# Patient Record
Sex: Female | Born: 1975 | Hispanic: No | Marital: Married | Smoking: Never smoker
Health system: Southern US, Community
[De-identification: ages and names within clinical notes are randomized; demographics above are authoritative.]

## PROBLEM LIST (undated history)

## (undated) DIAGNOSIS — J302 Other seasonal allergic rhinitis: Secondary | ICD-10-CM

## (undated) DIAGNOSIS — L309 Dermatitis, unspecified: Secondary | ICD-10-CM

## (undated) DIAGNOSIS — M797 Fibromyalgia: Secondary | ICD-10-CM

## (undated) HISTORY — PX: SEPTOPLASTY: SUR1290

## (undated) HISTORY — PX: TUBAL LIGATION: SHX77

## (undated) HISTORY — PX: WISDOM TOOTH EXTRACTION: SHX21

## (undated) HISTORY — PX: NASAL SINUS SURGERY: SHX719

## (undated) HISTORY — PX: LIPOMA EXCISION: SHX5283

---

## 2009-07-23 ENCOUNTER — Ambulatory Visit (HOSPITAL_COMMUNITY): Admission: RE | Admit: 2009-07-23 | Discharge: 2009-07-23 | Payer: Self-pay | Admitting: Obstetrics and Gynecology

## 2010-10-23 HISTORY — PX: DILATION AND CURETTAGE OF UTERUS: SHX78

## 2010-12-30 ENCOUNTER — Other Ambulatory Visit: Payer: Self-pay | Admitting: Obstetrics and Gynecology

## 2010-12-30 ENCOUNTER — Ambulatory Visit (HOSPITAL_COMMUNITY)
Admission: AD | Admit: 2010-12-30 | Discharge: 2010-12-30 | Disposition: A | Discharge status: Home / Self Care | Payer: BC Managed Care – PPO | Source: Ambulatory Visit | Attending: Obstetrics and Gynecology | Admitting: Obstetrics and Gynecology

## 2010-12-30 DIAGNOSIS — N949 Unspecified condition associated with female genital organs and menstrual cycle: Secondary | ICD-10-CM | POA: Insufficient documentation

## 2010-12-30 DIAGNOSIS — N938 Other specified abnormal uterine and vaginal bleeding: Secondary | ICD-10-CM | POA: Insufficient documentation

## 2010-12-30 DIAGNOSIS — D500 Iron deficiency anemia secondary to blood loss (chronic): Secondary | ICD-10-CM | POA: Insufficient documentation

## 2010-12-30 LAB — CBC
HCT: 37.9 % (ref 36.0–46.0)
MCHC: 31.1 g/dL (ref 30.0–36.0)
Platelets: 365 10*3/uL (ref 150–400)
RDW: 14.1 % (ref 11.5–15.5)
WBC: 10.7 10*3/uL — ABNORMAL HIGH (ref 4.0–10.5)

## 2011-01-14 NOTE — Op Note (Signed)
  Lisa [name], Lisa [name]               ACCOUNT NO.:  [id number]  MEDICAL RECORD NO.:  [id number]           PATIENT TYPE:  O  LOCATION:  [facility]                          [facility]AC[facility]:  [facility]  PHYSICIAN:  Juluis Mire, M.D.   DATE OF BIRTH:  23-Jul-1976  DATE OF PROCEDURE:  12/30/2010 DATE OF DISCHARGE:                              OPERATIVE REPORT   PREOPERATIVE DIAGNOSIS:  Dysfunction uterine bleeding with associated anemia.  POSTOPERATIVE DIAGNOSIS:  Dysfunction uterine bleeding with associated anemia.  OPERATIVE PROCEDURE:  Paracervical block with dilatation and curettage.  SURGEON:  Juluis Mire, MD.  ANESTHESIA:  General along with paracervical block.  ESTIMATED BLOOD LOSS:  Probably 200 mL.  PACKS AND DRAINS:  None.  INJECTABLES PLACED:  None.  COMPLICATIONS:  None.  INDICATIONS:  Dictated in the history and physical.  PROCEDURE:  As follows, the patient was taken to the OR and placed in supine position.  After satisfactory level of general anesthesia was obtained, the patient was placed in the dorsal lithotomy position using the Allen stirrups.  The perineum and vagina are prepped out with Betadine and draped into sterile field.  Speculum was placed in the vaginal vault.  Moderate clots and bleeding were noted.  Cervix was grasped with a single-tooth tenaculum.  We were able to insert a paracervical block using 1% Xylocaine with epinephrine.  Cervix was actually already fully dilated and we were able to insert a curette.  We sharply curetted the uterine cavity retrieving moderate amounts of tissue and clotted material.  This was sent for pathological review.  We continue curetting until no additional tissue was obtained and we felt a gritty feeling in all quadrants.  We then explored the uterine cavity with ring forceps.  There was no signs of additional tissue.  Repeat curetting also retrieved no additional tissue.  At this point in time, the patient was contracting  down well.  Bleeding was minimal.  The single-tooth speculum was then removed.  The patient was taken out of dorsal lithotomy position.  Once alert and extubated, transferred to recovery room in good condition.  Sponge, instrument, and needle count was correct by circulating nurse.     Juluis Mire, M.D.     JSM/MEDQ  D:  12/30/2010  T:  12/31/2010  Job:  [id number]  Electronically Signed by Richardean Chimera M.D. on 01/14/2011 06:05:20 AM

## 2011-01-14 NOTE — H&P (Signed)
  NAME:  Lisa [name], Lisa [name]               ACCOUNT NO.:  [id number]  MEDICAL RECORD NO.:  [id number]           PATIENT TYPE:  O  LOCATION:  [facility]                          [facility]  PHYSICIAN:  Juluis Mire, M.D.   DATE OF BIRTH:  1976-06-09  DATE OF ADMISSION:  12/30/2010 DATE OF DISCHARGE:                             HISTORY & PHYSICAL   The patient is a 35 year old gravida 2, para 2 female who has been seen in our practice since early February with dysfunctional uterine bleeding.  She had been tried on various forms of birth control pills without response, came in today for an ultrasound, was having extremely excessive bleeding.  Her hemoglobin was down to 10.4.  On ultrasound evaluation, she has markedly thickened endometrium.  There is a lot of fluid in the cul-de-sac probably from the ongoing bleeding.  Both ovaries are normal.  On exam, she was having a fairly heavy significant bleeding with clots.  ALLERGIES:  In terms of allergies, she is allergic to VICODIN, PREDNISONE, PERCOCET and EFFEXOR.  PAST MEDICAL HISTORY:  She has history of arthritis as well as migraine headaches.  PAST SURGICAL HISTORY:  Removal of fatty tumor, septoplasty, sinus surgery and 2 vaginal deliveries and I believe she has had an Essure tubal.  FAMILY HISTORY:  History of hypertension, diabetes and heart disease.  SOCIAL HISTORY:  No tobacco or alcohol use.  REVIEW OF SYSTEMS:  Noncontributory.  PHYSICAL EXAMINATION:  VITAL SIGNS:  The patient is afebrile with stable vital signs. LUNGS:  Clear. CARDIOVASCULAR:  Regular rhythm rate.  There are no murmurs or gallops. ABDOMEN:  Benign.  No mass, organomegaly or tenderness. PELVIS:  She is having heavy bleeding with clots.  Cervix is partially dilated.  Uterus upper limits of normal size.  Adnexa unremarkable. EXTREMITIES:  Trace edema. NEUROLOGIC:  Grossly within normal limits.  IMPRESSION:  Dysfunctional bleeding with associated menorrhagia  and anemia.  PLAN:  The patient will be brought in and undergo a D and C to stop the bleeding.  We may observe her overnight pending the results of the D and C.  The nature of a D and C, risks and benefits have been discussed including the risk of infection.  Risk of hemorrhage that could require continued need for transfusion, excessive bleeding could require hysterectomy.  There is a risk of perforation leading to injury to adjacent organs requiring further exploratory surgery.  Risk of deep venous thrombosis and pulmonary embolus.  The patient expressed understanding to potential risks and complications.     Juluis Mire, M.D.     JSM/MEDQ  D:  12/30/2010  T:  12/31/2010  Job:  [id number]  Electronically Signed by Richardean Chimera M.D. on 01/14/2011 06:05:18 AM

## 2011-01-26 LAB — CBC
MCHC: 33.7 g/dL (ref 30.0–36.0)
MCV: 84 fL (ref 78.0–100.0)
Platelets: 348 10*3/uL (ref 150–400)
WBC: 9.4 10*3/uL (ref 4.0–10.5)

## 2011-01-26 LAB — PREGNANCY, URINE: Preg Test, Ur: NEGATIVE

## 2011-10-24 HISTORY — PX: MOUTH SURGERY: SHX715

## 2012-08-19 ENCOUNTER — Encounter (HOSPITAL_COMMUNITY): Payer: Self-pay | Admitting: Pharmacist

## 2012-08-27 ENCOUNTER — Inpatient Hospital Stay (HOSPITAL_COMMUNITY): Admission: RE | Admit: 2012-08-27 | Payer: BC Managed Care – PPO | Source: Ambulatory Visit

## 2012-08-27 NOTE — H&P (Signed)
NAMEREAGHAN, KAWA               ACCOUNT NO.:  [id number]  MEDICAL RECORD NO.:  [id number]  LOCATION:  [facility]                         [facility]  PHYSICIAN:  Juluis Mire, M.D.   DATE OF BIRTH:  August 24, 1976  DATE OF ADMISSION:  07/25/2012 DATE OF DISCHARGE:                             HISTORY & PHYSICAL   Surgery was scheduled for September 02, 2012, at [facility] here in [place].  HISTORY OF PRESENT ILLNESS:  The patient is a 36 year old gravida 2, para 2 married female, presents for laparoscopic-assisted vaginal hysterectomy.  In relation to the present admission, the patient has been trouble with persistent abnormal uterine bleeding.  It did require a D and C for management due to excessive bleeding and a lower in hemoglobin.  This D and C was performed on March 9 of this year. Pathology from the endometrium revealed fragments of the endometrial polyps with no evidence of hyperplasia or malignancy.  She has continued to have irregular abnormal bleeding.  We tried to control it with hormonal means including the Mirena IUD without success.  After discussion of options, she now presents for laparoscopic-assisted vaginal hysterectomy.  ALLERGIES:  In terms of allergy, she is allergic to PERCOCET, EFFEXOR, and VICODIN.  Also allergic to PREDNISONE.  PAST MEDICAL HISTORY:  She does have a history of arthritis as well as migraine headaches.  PREVIOUS SURGICAL HISTORY:  She has had a fatty tumor removed.  She has had a septoplasty, sinus surgery, bilateral tubal ligation, two vaginal deliveries.  SOCIAL HISTORY:  No tobacco and occasional alcohol use.  FAMILY HISTORY:  Family history of hypertension, colon cancer, diabetes, phlebitis.  REVIEW OF SYSTEMS:  Noncontributory.  PHYSICAL EXAMINATION:  VITAL SIGNS:  The patient is afebrile with stable vital signs. HEENT:  The patient is normocephalic.  Pupils are equal, round, and reactive to light and accommodation.   Extraocular movements are intact. Sclerae and conjunctivae are clear.  Oropharynx clear. NECK:  Without thyromegaly. BREASTS:  Not examined. LUNGS:  Clear. CARDIOVASCULAR:  Regular rhythm and rate without murmurs or gallops. ABDOMEN:  Benign.  No mass, organomegaly, or tenderness. PELVIC:  Normal external genitalia.  Vaginal mucosa clear.  Cervix unremarkable.  Uterus, upper limits of normal size.  Adnexa unremarkable. EXTREMITIES:  Trace edema. NEUROLOGIC:  Grossly within normal limits.  IMPRESSION:  Abnormal uterine bleeding with associated anemia, unable to control with conservative management.  PLAN:  The patient to undergo laparoscopic-assisted vaginal hysterectomy.  The nature of the procedure have been discussed.  Risks include the risk of infection.  The risk of hemorrhage that could require transfusion with the risk of AIDS or hepatitis.  There is a risk of injury to adjacent organs including bladder, bowel, ureters that could require further exploratory surgery, risk of deep venous thrombosis and pulmonary embolus.  The patient expressed understanding of indications, risks, and alternatives.     Juluis Mire, M.D.     JSM/MEDQ  D:  08/27/2012  T:  08/27/2012  Job:  [id number]

## 2012-08-27 NOTE — H&P (Signed)
  Patient name  Lisa [name], Lisa [name] DICTATION#  [id number] CSN# [id number]  [facility], MD 08/27/2012 9:51 AM

## 2012-08-30 ENCOUNTER — Encounter (HOSPITAL_COMMUNITY)
Admission: RE | Admit: 2012-08-30 | Discharge: 2012-08-30 | Disposition: A | Discharge status: Home / Self Care | Payer: BC Managed Care – PPO | Source: Ambulatory Visit | Attending: Obstetrics and Gynecology | Admitting: Obstetrics and Gynecology

## 2012-08-30 ENCOUNTER — Encounter (HOSPITAL_COMMUNITY): Payer: Self-pay

## 2012-08-30 HISTORY — DX: Other seasonal allergic rhinitis: J30.2

## 2012-08-30 HISTORY — DX: Fibromyalgia: M79.7

## 2012-08-30 HISTORY — DX: Dermatitis, unspecified: L30.9

## 2012-08-30 LAB — CBC
MCH: 27 pg (ref 26.0–34.0)
MCV: 83.7 fL (ref 78.0–100.0)
Platelets: 308 10*3/uL (ref 150–400)
RDW: 14.2 % (ref 11.5–15.5)
WBC: 9.3 10*3/uL (ref 4.0–10.5)

## 2012-08-30 LAB — SURGICAL PCR SCREEN: MRSA, PCR: NEGATIVE

## 2012-08-30 NOTE — Patient Instructions (Addendum)
   Your procedure is scheduled WR:UEAVWU November 11th  Enter through the Main Entrance of [facility] at:6am Pick up the phone at the desk and dial [phone] and inform us of your arrival.  Please call this number if you have any problems the morning of surgery: [phone]  Remember: Do not eat or drink anything after midnight on Sunday night   Do not wear jewelry, make-up, or FINGER nail polish No metal in your hair or on your body. Do not wear lotions, powders, perfumes. You may wear deodorant.  Please use your CHG wash as directed prior to surgery.  Do not shave anywhere for at least 12 hours prior to first CHG shower.  Do not bring valuables to the hospital. Please bring a case to protect your eyeglasses  Leave suitcase in the car. After Surgery it may be brought to your room. For patients being admitted to the hospital, checkout time is 11:00am the day of discharge.

## 2012-09-01 ENCOUNTER — Encounter (HOSPITAL_COMMUNITY): Payer: Self-pay | Admitting: Anesthesiology

## 2012-09-01 NOTE — Anesthesia Preprocedure Evaluation (Addendum)
Anesthesia Evaluation  Patient identified by MRN, date of birth, ID band Patient awake    Reviewed: Allergy & Precautions, H&P , NPO status , Patient's Chart, lab work & pertinent test results  Airway Mallampati: III TM Distance: >3 FB Neck ROM: Full    Dental No notable dental hx. (+) Teeth Intact   Pulmonary neg pulmonary ROS,  breath sounds clear to auscultation  Pulmonary exam normal       Cardiovascular negative cardio ROS  Rhythm:Regular Rate:Normal     Neuro/Psych  Neuromuscular disease negative psych ROS   GI/Hepatic negative GI ROS, Neg liver ROS,   Endo/Other  negative endocrine ROS  Renal/GU negative Renal ROS  negative genitourinary   Musculoskeletal  (+) Fibromyalgia -  Abdominal Normal abdominal exam  (+) + obese,   Peds  Hematology negative hematology ROS (+)   Anesthesia Other Findings   Reproductive/Obstetrics Menorrhagia                          Anesthesia Physical Anesthesia Plan  ASA: II  Anesthesia Plan: General   Post-op Pain Management:    Induction: Intravenous  Airway Management Planned: Oral ETT  Additional Equipment:   Intra-op Plan:   Post-operative Plan: Extubation in OR  Informed Consent: I have reviewed the patients History and Physical, chart, labs and discussed the procedure including the risks, benefits and alternatives for the proposed anesthesia with the patient or authorized representative who has indicated his/her understanding and acceptance.   Dental advisory given  Plan Discussed with: Anesthesiologist, CRNA and Surgeon  Anesthesia Plan Comments:         Anesthesia Quick Evaluation

## 2012-09-02 ENCOUNTER — Encounter (HOSPITAL_COMMUNITY): Payer: Self-pay | Admitting: *Deleted

## 2012-09-02 ENCOUNTER — Encounter (HOSPITAL_COMMUNITY)
Admission: RE | Disposition: A | Discharge status: Home / Self Care | Payer: Self-pay | Source: Ambulatory Visit | Attending: Obstetrics and Gynecology

## 2012-09-02 ENCOUNTER — Observation Stay (HOSPITAL_COMMUNITY)
Admission: RE | Admit: 2012-09-02 | Discharge: 2012-09-03 | Disposition: A | Discharge status: Home / Self Care | Payer: BC Managed Care – PPO | Source: Ambulatory Visit | Attending: Obstetrics and Gynecology | Admitting: Obstetrics and Gynecology

## 2012-09-02 ENCOUNTER — Encounter (HOSPITAL_COMMUNITY): Payer: Self-pay | Admitting: Anesthesiology

## 2012-09-02 ENCOUNTER — Ambulatory Visit (HOSPITAL_COMMUNITY): Payer: BC Managed Care – PPO | Admitting: Anesthesiology

## 2012-09-02 DIAGNOSIS — N8000 Endometriosis of the uterus, unspecified: Secondary | ICD-10-CM | POA: Insufficient documentation

## 2012-09-02 DIAGNOSIS — Z01818 Encounter for other preprocedural examination: Secondary | ICD-10-CM | POA: Insufficient documentation

## 2012-09-02 DIAGNOSIS — D252 Subserosal leiomyoma of uterus: Secondary | ICD-10-CM | POA: Insufficient documentation

## 2012-09-02 DIAGNOSIS — N920 Excessive and frequent menstruation with regular cycle: Principal | ICD-10-CM | POA: Insufficient documentation

## 2012-09-02 DIAGNOSIS — Z01812 Encounter for preprocedural laboratory examination: Secondary | ICD-10-CM | POA: Insufficient documentation

## 2012-09-02 DIAGNOSIS — N946 Dysmenorrhea, unspecified: Secondary | ICD-10-CM

## 2012-09-02 HISTORY — PX: LAPAROSCOPIC ASSISTED VAGINAL HYSTERECTOMY: SHX5398

## 2012-09-02 SURGERY — HYSTERECTOMY, VAGINAL, LAPAROSCOPY-ASSISTED
Anesthesia: General | Site: Abdomen | Wound class: Clean Contaminated

## 2012-09-02 MED ORDER — NEOSTIGMINE METHYLSULFATE 1 MG/ML IJ SOLN
INTRAMUSCULAR | Status: DC | PRN
  Administered 2012-09-02: 2 mg via INTRAVENOUS

## 2012-09-02 MED ORDER — LIDOCAINE HCL (CARDIAC) 20 MG/ML IV SOLN
INTRAVENOUS | Status: AC
  Filled 2012-09-02: qty 5

## 2012-09-02 MED ORDER — METOCLOPRAMIDE HCL 5 MG/ML IJ SOLN: 10.0000 mg | Freq: Once | INTRAMUSCULAR | Status: DC | PRN

## 2012-09-02 MED ORDER — LACTATED RINGERS IV SOLN
INTRAVENOUS | Status: DC
  Administered 2012-09-02 (×3): via INTRAVENOUS

## 2012-09-02 MED ORDER — NEOSTIGMINE METHYLSULFATE 1 MG/ML IJ SOLN
INTRAMUSCULAR | Status: AC
  Filled 2012-09-02: qty 10

## 2012-09-02 MED ORDER — GLYCOPYRROLATE 0.2 MG/ML IJ SOLN
INTRAMUSCULAR | Status: DC | PRN
  Administered 2012-09-02: 0.4 mg via INTRAVENOUS

## 2012-09-02 MED ORDER — FENTANYL CITRATE 0.05 MG/ML IJ SOLN
INTRAMUSCULAR | Status: AC
  Filled 2012-09-02: qty 5

## 2012-09-02 MED ORDER — DEXAMETHASONE SODIUM PHOSPHATE 10 MG/ML IJ SOLN
INTRAMUSCULAR | Status: AC
  Filled 2012-09-02: qty 1

## 2012-09-02 MED ORDER — MEPERIDINE HCL 25 MG/ML IJ SOLN: 6.2500 mg | INTRAMUSCULAR | Status: DC | PRN

## 2012-09-02 MED ORDER — ONDANSETRON HCL 4 MG/2ML IJ SOLN
INTRAMUSCULAR | Status: AC
  Filled 2012-09-02: qty 2

## 2012-09-02 MED ORDER — DIPHENHYDRAMINE HCL 12.5 MG/5ML PO ELIX
12.5000 mg | ORAL_SOLUTION | Freq: Four times a day (QID) | ORAL | Status: DC | PRN
  Filled 2012-09-02: qty 5

## 2012-09-02 MED ORDER — MIDAZOLAM HCL 2 MG/2ML IJ SOLN
INTRAMUSCULAR | Status: AC
  Filled 2012-09-02: qty 2

## 2012-09-02 MED ORDER — FENTANYL CITRATE 0.05 MG/ML IJ SOLN
INTRAMUSCULAR | Status: AC
  Administered 2012-09-02: 50 ug via INTRAVENOUS
  Filled 2012-09-02: qty 2

## 2012-09-02 MED ORDER — DIPHENHYDRAMINE HCL 50 MG/ML IJ SOLN
INTRAMUSCULAR | Status: AC
  Administered 2012-09-02: 12.5 mg via INTRAVENOUS
  Filled 2012-09-02: qty 1

## 2012-09-02 MED ORDER — ZOLPIDEM TARTRATE 5 MG PO TABS: 5.0000 mg | ORAL_TABLET | Freq: Every evening | ORAL | Status: DC | PRN

## 2012-09-02 MED ORDER — FENTANYL CITRATE 0.05 MG/ML IJ SOLN
25.0000 ug | INTRAMUSCULAR | Status: DC | PRN
  Administered 2012-09-02 (×2): 50 ug via INTRAVENOUS

## 2012-09-02 MED ORDER — ONDANSETRON HCL 4 MG/2ML IJ SOLN: 4.0000 mg | Freq: Four times a day (QID) | INTRAMUSCULAR | Status: DC | PRN

## 2012-09-02 MED ORDER — LACTATED RINGERS IR SOLN
Status: DC | PRN
  Administered 2012-09-02: 3000 mL

## 2012-09-02 MED ORDER — ROCURONIUM BROMIDE 50 MG/5ML IV SOLN
INTRAVENOUS | Status: AC
  Filled 2012-09-02: qty 1

## 2012-09-02 MED ORDER — DEXAMETHASONE SODIUM PHOSPHATE 10 MG/ML IJ SOLN
INTRAMUSCULAR | Status: DC | PRN
  Administered 2012-09-02: 10 mg via INTRAVENOUS

## 2012-09-02 MED ORDER — FENTANYL CITRATE 0.05 MG/ML IJ SOLN
INTRAMUSCULAR | Status: DC | PRN
  Administered 2012-09-02: 50 ug via INTRAVENOUS
  Administered 2012-09-02 (×2): 100 ug via INTRAVENOUS
  Administered 2012-09-02 (×3): 50 ug via INTRAVENOUS
  Administered 2012-09-02: 100 ug via INTRAVENOUS

## 2012-09-02 MED ORDER — MENTHOL 3 MG MT LOZG: 1.0000 | LOZENGE | OROMUCOSAL | Status: DC | PRN

## 2012-09-02 MED ORDER — MIDAZOLAM HCL 5 MG/5ML IJ SOLN
INTRAMUSCULAR | Status: DC | PRN
  Administered 2012-09-02: 2 mg via INTRAVENOUS

## 2012-09-02 MED ORDER — PROPOFOL 10 MG/ML IV EMUL
INTRAVENOUS | Status: AC
  Filled 2012-09-02: qty 20

## 2012-09-02 MED ORDER — CEFAZOLIN SODIUM-DEXTROSE 2-3 GM-% IV SOLR
2.0000 g | INTRAVENOUS | Status: AC
  Administered 2012-09-02: 2 g via INTRAVENOUS

## 2012-09-02 MED ORDER — BUPIVACAINE HCL (PF) 0.25 % IJ SOLN
INTRAMUSCULAR | Status: DC | PRN
  Administered 2012-09-02: 30 mL

## 2012-09-02 MED ORDER — ROCURONIUM BROMIDE 100 MG/10ML IV SOLN
INTRAVENOUS | Status: DC | PRN
  Administered 2012-09-02: 40 mg via INTRAVENOUS

## 2012-09-02 MED ORDER — LIDOCAINE HCL (CARDIAC) 20 MG/ML IV SOLN
INTRAVENOUS | Status: DC | PRN
  Administered 2012-09-02: 50 mg via INTRAVENOUS

## 2012-09-02 MED ORDER — BUPIVACAINE-EPINEPHRINE (PF) 0.5% -1:200000 IJ SOLN
INTRAMUSCULAR | Status: AC
  Filled 2012-09-02: qty 10

## 2012-09-02 MED ORDER — ACETAMINOPHEN 325 MG PO TABS
650.0000 mg | ORAL_TABLET | ORAL | Status: DC | PRN
  Administered 2012-09-03: 650 mg via ORAL
  Filled 2012-09-02: qty 2

## 2012-09-02 MED ORDER — PROPOFOL 10 MG/ML IV BOLUS
INTRAVENOUS | Status: DC | PRN
  Administered 2012-09-02: 150 mg via INTRAVENOUS

## 2012-09-02 MED ORDER — HYDROMORPHONE 0.3 MG/ML IV SOLN
INTRAVENOUS | Status: DC
  Administered 2012-09-02: 11:00:00 via INTRAVENOUS
  Administered 2012-09-02: 0.2 mg via INTRAVENOUS
  Administered 2012-09-02 (×2): 0.799 mg via INTRAVENOUS
  Filled 2012-09-02: qty 25

## 2012-09-02 MED ORDER — 0.9 % SODIUM CHLORIDE (POUR BTL) OPTIME
TOPICAL | Status: DC | PRN
  Administered 2012-09-02: 1000 mL

## 2012-09-02 MED ORDER — CEFAZOLIN SODIUM-DEXTROSE 2-3 GM-% IV SOLR
INTRAVENOUS | Status: AC
  Filled 2012-09-02: qty 50

## 2012-09-02 MED ORDER — NALOXONE HCL 0.4 MG/ML IJ SOLN: 0.4000 mg | INTRAMUSCULAR | Status: DC | PRN

## 2012-09-02 MED ORDER — ONDANSETRON HCL 4 MG PO TABS: 4.0000 mg | ORAL_TABLET | Freq: Four times a day (QID) | ORAL | Status: DC | PRN

## 2012-09-02 MED ORDER — INDIGOTINDISULFONATE SODIUM 8 MG/ML IJ SOLN
INTRAMUSCULAR | Status: DC | PRN
  Administered 2012-09-02: 5 mL via INTRAVENOUS

## 2012-09-02 MED ORDER — BUPIVACAINE HCL (PF) 0.25 % IJ SOLN
INTRAMUSCULAR | Status: AC
  Filled 2012-09-02: qty 30

## 2012-09-02 MED ORDER — ONDANSETRON HCL 4 MG/2ML IJ SOLN
INTRAMUSCULAR | Status: DC | PRN
  Administered 2012-09-02: 4 mg via INTRAVENOUS

## 2012-09-02 MED ORDER — SODIUM CHLORIDE 0.9 % IJ SOLN: 9.0000 mL | INTRAMUSCULAR | Status: DC | PRN

## 2012-09-02 MED ORDER — PREGABALIN 50 MG PO CAPS
100.0000 mg | ORAL_CAPSULE | Freq: Every day | ORAL | Status: DC
  Administered 2012-09-02: 100 mg via ORAL
  Filled 2012-09-02 (×2): qty 2

## 2012-09-02 MED ORDER — GLYCOPYRROLATE 0.2 MG/ML IJ SOLN
INTRAMUSCULAR | Status: AC
  Filled 2012-09-02: qty 2

## 2012-09-02 MED ORDER — INFLUENZA VIRUS VACC SPLIT PF IM SUSP
0.5000 mL | INTRAMUSCULAR | Status: AC
  Administered 2012-09-03: 0.5 mL via INTRAMUSCULAR
  Filled 2012-09-02: qty 0.5

## 2012-09-02 MED ORDER — DIPHENHYDRAMINE HCL 50 MG/ML IJ SOLN
12.5000 mg | Freq: Four times a day (QID) | INTRAMUSCULAR | Status: DC | PRN
  Administered 2012-09-02: 12.5 mg via INTRAVENOUS

## 2012-09-02 MED ORDER — INDIGOTINDISULFONATE SODIUM 8 MG/ML IJ SOLN
INTRAMUSCULAR | Status: AC
  Filled 2012-09-02: qty 5

## 2012-09-02 MED ORDER — LACTATED RINGERS IV SOLN
INTRAVENOUS | Status: DC
  Administered 2012-09-02 (×2): via INTRAVENOUS

## 2012-09-02 SURGICAL SUPPLY — 45 items
CABLE HIGH FREQUENCY MONO STRZ (ELECTRODE) IMPLANT
CATH ROBINSON RED A/P 16FR (CATHETERS) ×2 IMPLANT
CLOTH BEACON ORANGE TIMEOUT ST (SAFETY) ×2 IMPLANT
CONT PATH 16OZ SNAP LID 3702 (MISCELLANEOUS) ×2 IMPLANT
COVER TABLE BACK 60X90 (DRAPES) ×2 IMPLANT
DECANTER SPIKE VIAL GLASS SM (MISCELLANEOUS) IMPLANT
DERMABOND ADVANCED (GAUZE/BANDAGES/DRESSINGS) ×1
DERMABOND ADVANCED .7 DNX12 (GAUZE/BANDAGES/DRESSINGS) ×1 IMPLANT
ELECT REM PT RETURN 9FT ADLT (ELECTROSURGICAL)
ELECTRODE REM PT RTRN 9FT ADLT (ELECTROSURGICAL) IMPLANT
GLOVE BIO SURGEON STRL SZ7 (GLOVE) ×8 IMPLANT
GLOVE BIO SURGEON STRL SZ7.5 (GLOVE) ×2 IMPLANT
GLOVE BIOGEL PI IND STRL 6.5 (GLOVE) ×1 IMPLANT
GLOVE BIOGEL PI IND STRL 7.0 (GLOVE) ×2 IMPLANT
GLOVE BIOGEL PI INDICATOR 6.5 (GLOVE) ×1
GLOVE BIOGEL PI INDICATOR 7.0 (GLOVE) ×2
GLOVE INDICATOR 7.0 STRL GRN (GLOVE) ×4 IMPLANT
GOWN PREVENTION PLUS LG XLONG (DISPOSABLE) ×6 IMPLANT
GOWN STRL REIN XL XLG (GOWN DISPOSABLE) ×2 IMPLANT
NEEDLE INSUFFLATION 14GA 120MM (NEEDLE) ×2 IMPLANT
NS IRRIG 1000ML POUR BTL (IV SOLUTION) ×2 IMPLANT
PACK LAVH (CUSTOM PROCEDURE TRAY) ×2 IMPLANT
PROTECTOR NERVE ULNAR (MISCELLANEOUS) ×2 IMPLANT
SEALER TISSUE G2 CVD JAW 45CM (ENDOMECHANICALS) ×2 IMPLANT
SET CYSTO W/LG BORE CLAMP LF (SET/KITS/TRAYS/PACK) ×2 IMPLANT
SET IRRIG TUBING LAPAROSCOPIC (IRRIGATION / IRRIGATOR) ×2 IMPLANT
SPONGE GAUZE 2X2 8PLY STRL LF (GAUZE/BANDAGES/DRESSINGS) ×2 IMPLANT
STRIP CLOSURE SKIN 1/4X3 (GAUZE/BANDAGES/DRESSINGS) IMPLANT
SUT MON AB 2-0 CT1 27 (SUTURE) ×4 IMPLANT
SUT VIC AB 0 CT1 18XCR BRD8 (SUTURE) ×2 IMPLANT
SUT VIC AB 0 CT1 27 (SUTURE) ×1
SUT VIC AB 0 CT1 27XBRD ANBCTR (SUTURE) ×1 IMPLANT
SUT VIC AB 0 CT1 36 (SUTURE) ×4 IMPLANT
SUT VIC AB 0 CT1 8-18 (SUTURE) ×2
SUT VICRYL 0 UR6 27IN ABS (SUTURE) IMPLANT
SUT VICRYL 1 TIES 12X18 (SUTURE) ×2 IMPLANT
SUT VICRYL 4-0 PS2 18IN ABS (SUTURE) ×2 IMPLANT
TAPE PAPER 2X10 WHT MICROPORE (GAUZE/BANDAGES/DRESSINGS) ×2 IMPLANT
TOWEL OR 17X24 6PK STRL BLUE (TOWEL DISPOSABLE) ×4 IMPLANT
TRAY FOLEY CATH 14FR (SET/KITS/TRAYS/PACK) ×2 IMPLANT
TROCAR BALLN 12MMX100 BLUNT (TROCAR) IMPLANT
TROCAR Z-THREAD BLADED 11X100M (TROCAR) IMPLANT
TROCAR Z-THREAD BLADED 5X100MM (TROCAR) ×2 IMPLANT
WARMER LAPAROSCOPE (MISCELLANEOUS) ×2 IMPLANT
WATER STERILE IRR 1000ML POUR (IV SOLUTION) ×2 IMPLANT

## 2012-09-02 NOTE — Op Note (Signed)
Patient name  Lisa [name], Lisa [name] DICTATION#  [id number] CSN# [id number]  [facility], MD 09/02/2012 8:45 AM

## 2012-09-02 NOTE — Anesthesia Postprocedure Evaluation (Signed)
Anesthesia Post Note  Patient: Lisa [name]  Procedure(s) Performed: Procedure(s) (LRB): LAPAROSCOPIC ASSISTED VAGINAL HYSTERECTOMY (N/A)  Anesthesia type: GA  Patient location: PACU  Post pain: Pain level controlled  Post assessment: Post-op Vital signs reviewed  Last Vitals:  Filed Vitals:   09/02/12 0945  BP: 107/77  Pulse: 108  Temp:   Resp: 15    Post vital signs: Reviewed  Level of consciousness: sedated  Complications: No apparent anesthesia complications

## 2012-09-02 NOTE — Addendum Note (Signed)
Addendum  created 09/02/12 1611 by Graciela Husbands, CRNA   Modules edited:Notes Section

## 2012-09-02 NOTE — Anesthesia Postprocedure Evaluation (Signed)
  Anesthesia Post-op Note  Patient: Lisa [name]  Procedure(s) Performed: Procedure(s) (LRB) with comments: LAPAROSCOPIC ASSISTED VAGINAL HYSTERECTOMY (N/A) - with cystoscopy  Patient Location: [facility] Unit  Anesthesia Type:General  Level of Consciousness: awake, alert  and oriented  Airway and Oxygen Therapy: Patient Spontanous Breathing and Patient connected to nasal cannula oxygen  Post-op Pain: mild  Post-op Assessment: Post-op Vital signs reviewed and Patient's Cardiovascular Status Stable  Post-op Vital Signs: Reviewed and stable  Complications: No apparent anesthesia complications

## 2012-09-02 NOTE — H&P (Signed)
  History and physical exam unchanged 

## 2012-09-02 NOTE — Progress Notes (Signed)
Patient ID: Lisa [name], female   DOB: February 03, 1976, 36 y.o.   MRN: [id number] SOME NAUSEA AF VSS GOOD UP  SOME DRAINAGE FROM UMBILICAL INCISION WATERY PROBABLY IRRIGATION NO VAGINAL BLEEING

## 2012-09-02 NOTE — Transfer of Care (Signed)
Immediate Anesthesia Transfer of Care Note  Patient: Lisa [name]  Procedure(s) Performed: Procedure(s) (LRB) with comments: LAPAROSCOPIC ASSISTED VAGINAL HYSTERECTOMY (N/A) - with bilateral salpingoopherectomy and cystoscopy  Patient Location: PACU  Anesthesia Type:General  Level of Consciousness: sedated  Airway & Oxygen Therapy: Patient Spontanous Breathing and Patient connected to nasal cannula oxygen  Post-op Assessment: Report given to PACU RN and Post -op Vital signs reviewed and stable  Post vital signs: stable  Complications: No apparent anesthesia complications

## 2012-09-02 NOTE — Op Note (Signed)
NAMEMAHLANI, BERNINGER               ACCOUNT NO.:  [id number]  MEDICAL RECORD NO.:  [id number]  LOCATION:  9318                          FACILITY:  [facility]  PHYSICIAN:  Juluis Mire, M.D.   DATE OF BIRTH:  February 08, 1976  DATE OF PROCEDURE:  09/02/2012 DATE OF DISCHARGE:                              OPERATIVE REPORT   PREOPERATIVE DIAGNOSIS:  Menorrhagia secondary to adenomyosis and uterine fibroids.  POSTOPERATIVE DIAGNOSIS:  Menorrhagia secondary to adenomyosis and uterine fibroids.  OPERATIVE PROCEDURE:  Laparoscopic-assisted vaginal hysterectomy with cystoscopy.  SURGEON:  Juluis Mire, M.D.  ASSISTANTFreddy Finner, M.D.  ANESTHESIA:  General endotracheal.  BLOOD LOSS:  500-600 mL.  PACKS:  None.  DRAINS:  Urethral Foley.  INTRAOPERATIVE BLOOD REPLACED:  None.  COMPLICATIONS:  None.  INDICATION:  Dictated in history and physical.  PROCEDURE:  The patient was taken to the OR and placed in supine position.  After satisfactory level of general endotracheal anesthesia was obtained, the patient was placed in the dorsal lithotomy position using the Allen stirrups.  The abdomen, perineum, vagina were prepped out with Betadine.  Bladder was emptied by in-and-out catheterization. A Hulka tenaculum was put in place and secured.  The patient then draped in sterile field.  Subumbilical incision was made.  A Veress needle was introduced into the abdominal cavity.  The abdomen was inflated with approximately 3 liters of carbon dioxide.  A 10/11 blade trocar was inserted.  The laparoscope was introduced.  There was no evidence of injury to adjacent organs.  A 5-mm trocar was put in place in the suprapubic area.  Upper abdomen including liver tip of the gallbladder were clear.  Both lateral gutters were cleared.  Appendix was retrocecal, difficult to visualize.  Uterus was upper limits of normal size, small subserosal fibroid.  Both ovaries were normal.  She had some minimal  adhesions from an __________ to the right adnexa, which were taken down.  Next, using the EnSeal, both utero-ovarian pedicles were cauterized and incised.  Both fallopian tubes and mesosalpinx were cauterized and incised.  Both round ligaments were cauterized and incised.  At this point in time, the abdomen was inflated with carbon dioxide. Laparoscope was then removed.  The patient's legs were repositioned. The Hulka tenaculum was then taken out.  Weighted speculum was placed in the vaginal vault.  Cul-de-sac was entered sharply.  Both uterosacral ligaments were clamped, cut, and suture ligated with 0 Vicryl.  The reflection of vaginal mucosa anteriorly was incised and bladder was dissected superiorly.  Paracervical tissue was clamped, cut, and suture ligated with 0 Vicryl.  Vesicouterine space was identified, entered sharply and retractor was put in place using the clamp, cut, and tie technique with suture ligatures of 0 Vicryl.  The parametrium was serially separated from the sides of the uterus.  Uterus was then flipped.  Remaining pedicles were clamped and cut.  Uterus and cervix were passed off the operative field and sent to Pathology.  Held pedicles, secured with a free tie of 0 Vicryl.  We had bleeding coming from the left vaginal cuff.  This was clamped with an Allis.  Two figure- of-eights of 0  Vicryl were used to bring this under control.  The posterior vaginal cuff was run with a running locked suture of 0 Vicryl. Vaginal cuff was reapproximated with figure-of-eights of 2-0 Monocryl. The Foley was placed to straight drain with clear urine.  Legs were repositioned.  Laparoscope was reintroduced.  Abdomen was reinflated with carbon dioxide.  We irrigated the pelvis.  We had some oozing from the left round ligament, brought under control with the bipolar.  She had minimal oozing from the cuff, brought under control with the bipolar.  Right adnexum was hemostatically intact.   We deflated the abdomen and reinflated, no active bleeding was encountered.  The abdomen was deflated with carbon dioxide.  All trocars were removed. Subumbilical incision was closed with interrupted subcuticulars of 4-0 Vicryl.  Suprapubic incision was closed with Dermabond.  Because of some of the lateral sutures, we decided to cystoscope her. The Foley was removed.  The patient was given indigo carmine IV. Cystoscope was introduced.  There was no evidence of bladder injury. Blue-tinged urine were noted to be coming from both ureteral orifices. The cystoscope was discontinued at this point in time, the Foley was placed back to straight drain.  The patient was taken out of the dorsal supine position.  Once alert and extubated, transferred to the recovery room in good condition.  Sponge, instrument, and needle counts were reported as correct by circulating nurse x2.  The patient tolerated the procedure well, was returned to recovery room in good condition.     Juluis Mire, M.D.     JSM/MEDQ  D:  09/02/2012  T:  09/02/2012  Job:  [id number]

## 2012-09-02 NOTE — Brief Op Note (Signed)
09/02/2012  8:44 AM  PATIENT:  Lisa [name]  36 y.o. female  PRE-OPERATIVE DIAGNOSIS:  menorrhagia  POST-OPERATIVE DIAGNOSIS:  menorrhagia  PROCEDURE:  Procedure(s) (LRB) with comments: LAPAROSCOPIC ASSISTED VAGINAL HYSTERECTOMY (N/A) - with bilateral salpingoopherectomy and cystoscopy  SURGEON:  Surgeon(s) and Role:    * Juluis Mire, MD - Primary    * W Varney Baas, MD - Assisting  PHYSICIAN ASSISTANT:   ASSISTANTS: neal    ANESTHESIA:   general  EBL:  Total I/O In: 1000 [I.V.:1000] Out: 575 [Urine:75; Blood:500]  BLOOD ADMINISTERED:none  DRAINS: Urinary Catheter (Foley)   LOCAL MEDICATIONS USED:  MARCAINE     SPECIMEN:  Source of Specimen:  uterus  DISPOSITION OF SPECIMEN:  PATHOLOGY  COUNTS:  YES  TOURNIQUET:  * No tourniquets in log *  DICTATION: .Other Dictation: Dictation Number [id number]  PLAN OF CARE: Admit for overnight observation  PATIENT DISPOSITION:  PACU - hemodynamically stable.   Delay start of Pharmacological VTE agent (>24hrs) due to surgical blood loss or risk of bleeding: no

## 2012-09-03 ENCOUNTER — Encounter (HOSPITAL_COMMUNITY): Payer: Self-pay | Admitting: Obstetrics and Gynecology

## 2012-09-03 DIAGNOSIS — N920 Excessive and frequent menstruation with regular cycle: Secondary | ICD-10-CM

## 2012-09-03 LAB — CBC
MCHC: 30.6 g/dL (ref 30.0–36.0)
Platelets: 297 10*3/uL (ref 150–400)
Platelets: 305 10*3/uL (ref 150–400)
RBC: 2.95 MIL/uL — ABNORMAL LOW (ref 3.87–5.11)
RDW: 14.2 % (ref 11.5–15.5)
WBC: 13.6 10*3/uL — ABNORMAL HIGH (ref 4.0–10.5)

## 2012-09-03 MED ORDER — HYDROMORPHONE HCL 2 MG PO TABS: 2.0000 mg | ORAL_TABLET | ORAL | Status: DC | PRN

## 2012-09-03 NOTE — Progress Notes (Signed)
1 Day Post-Op Procedure([name]) (LRB): LAPAROSCOPIC ASSISTED VAGINAL HYSTERECTOMY (N/A)  Subjective: Patient reports tolerating PO and + flatus.    Objective: I have reviewed patient'[name] vital signs and labs.  General: alert GI: soft, non-tender; bowel sounds normal; no masses,  no organomegaly and incision: clean and dry Vaginal Bleeding: minimal  Assessment: [name] Procedure([name]) (LRB) with comments: LAPAROSCOPIC ASSISTED VAGINAL HYSTERECTOMY (N/A) - with cystoscopy: stable  Plan: Discharge home  LOS: 1 day    Lisa [name] 09/03/2012, 9:19 AM

## 2012-09-03 NOTE — Discharge Summary (Signed)
  Patient name  Chattie, Greeson DICTATION#  [id number] CSN# [id number]  [facility], MD 09/03/2012 9:23 AM

## 2012-09-03 NOTE — Progress Notes (Signed)
Pt out  In wheelchair   Teaching complete  

## 2012-09-03 NOTE — Progress Notes (Signed)
Post-discharge UR chart review completed. 

## 2012-09-04 NOTE — Discharge Summary (Signed)
NAMEMICHIKO, Lisa [name]               ACCOUNT NO.:  [id number]  MEDICAL RECORD NO.:  [id number]  LOCATION:  9318                          [facility]ACILITY:  [facility]  PHYSICIAN:  Juluis Mire, M.D.   DATE OF BIRTH:  [birth year]  DATE OF ADMISSION:  09/02/2012 DATE OF DISCHARGE:  09/03/2012                              DISCHARGE SUMMARY   ADMITTING DIAGNOSIS:  Menorrhagia secondary to adenomyosis.  DISCHARGE DIAGNOSIS:  Menorrhagia secondary to adenomyosis.  OPERATIVE PROCEDURE:  Laparoscopic-assisted vaginal hysterectomy.  For complete history and physical, please see dictated note.  COURSE IN THE HOSPITAL:  The patient underwent laparoscopic-assisted vaginal hysterectomy.  She did well that evening.  She had some oozing from the umbilical incision.  This was redressed.  It looked to be a clear blood tinged fluid such as might be seen with irrigation.  Her urine output remained adequate.  The following morning, she was afebrile with stable vital signs.  Abdomen was soft.  Bowel sounds were active. Incision was clear.  No active bleeding or oozing was noted.  Her hemoglobin that morning was 7.9 which is a bit lower than we expected. We went ahead and repeated and it was 7.8 which was stable and she seemed to be doing well with this.  Decision was to discharge her home at this time.  In terms of complications, none were encountered during her stay in the hospital. The patient discharged in stable condition.  DISPOSITION:  The patient to be discharged home.  She will have Dilaudid as needed for pain, iron sulfate supplementation, stool softener.  She is going to be followed in the office later this week for hemoglobin recheck.  She is to call should there be any signs of infection in terms of fever.  Should call with nausea, vomiting which could be a sign of bowel obstruction.  Should report any heavy vaginal bleeding or excessive pain.  Instructed on signs and symptoms of deep  venous thrombosis and pulmonary embolus.  Our office will call tomorrow and arrange followup.  She is to avoid heavy lifting, vaginal entrance, or driving the car.     Juluis Mire, M.D.     JSM/MEDQ  D:  09/03/2012  T:  09/04/2012  Job:  [id number]

## 2020-06-16 ENCOUNTER — Emergency Department (HOSPITAL_COMMUNITY): Payer: BC Managed Care – PPO

## 2020-06-16 ENCOUNTER — Other Ambulatory Visit: Payer: Self-pay

## 2020-06-16 ENCOUNTER — Inpatient Hospital Stay (HOSPITAL_COMMUNITY)
Admission: EM | Admit: 2020-06-16 | Discharge: 2020-07-23 | DRG: 870 | Disposition: E | Payer: BC Managed Care – PPO | Attending: Pulmonary Disease | Admitting: Pulmonary Disease

## 2020-06-16 ENCOUNTER — Encounter (HOSPITAL_COMMUNITY): Payer: Self-pay

## 2020-06-16 DIAGNOSIS — J80 Acute respiratory distress syndrome: Secondary | ICD-10-CM | POA: Diagnosis present

## 2020-06-16 DIAGNOSIS — Z7951 Long term (current) use of inhaled steroids: Secondary | ICD-10-CM

## 2020-06-16 DIAGNOSIS — J1282 Pneumonia due to coronavirus disease 2019: Secondary | ICD-10-CM | POA: Diagnosis present

## 2020-06-16 DIAGNOSIS — R6521 Severe sepsis with septic shock: Secondary | ICD-10-CM | POA: Diagnosis not present

## 2020-06-16 DIAGNOSIS — I82452 Acute embolism and thrombosis of left peroneal vein: Secondary | ICD-10-CM | POA: Diagnosis not present

## 2020-06-16 DIAGNOSIS — U071 COVID-19: Secondary | ICD-10-CM | POA: Diagnosis not present

## 2020-06-16 DIAGNOSIS — Z7722 Contact with and (suspected) exposure to environmental tobacco smoke (acute) (chronic): Secondary | ICD-10-CM | POA: Diagnosis present

## 2020-06-16 DIAGNOSIS — Z66 Do not resuscitate: Secondary | ICD-10-CM | POA: Diagnosis not present

## 2020-06-16 DIAGNOSIS — A4189 Other specified sepsis: Secondary | ICD-10-CM | POA: Diagnosis not present

## 2020-06-16 DIAGNOSIS — Z515 Encounter for palliative care: Secondary | ICD-10-CM | POA: Diagnosis not present

## 2020-06-16 DIAGNOSIS — Z888 Allergy status to other drugs, medicaments and biological substances status: Secondary | ICD-10-CM

## 2020-06-16 DIAGNOSIS — R739 Hyperglycemia, unspecified: Secondary | ICD-10-CM | POA: Diagnosis present

## 2020-06-16 DIAGNOSIS — N179 Acute kidney failure, unspecified: Secondary | ICD-10-CM

## 2020-06-16 DIAGNOSIS — L309 Dermatitis, unspecified: Secondary | ICD-10-CM | POA: Diagnosis present

## 2020-06-16 DIAGNOSIS — D649 Anemia, unspecified: Secondary | ICD-10-CM | POA: Diagnosis not present

## 2020-06-16 DIAGNOSIS — A4102 Sepsis due to Methicillin resistant Staphylococcus aureus: Secondary | ICD-10-CM | POA: Diagnosis present

## 2020-06-16 DIAGNOSIS — Z452 Encounter for adjustment and management of vascular access device: Secondary | ICD-10-CM

## 2020-06-16 DIAGNOSIS — Z6836 Body mass index (BMI) 36.0-36.9, adult: Secondary | ICD-10-CM

## 2020-06-16 DIAGNOSIS — M797 Fibromyalgia: Secondary | ICD-10-CM | POA: Diagnosis present

## 2020-06-16 DIAGNOSIS — R34 Anuria and oliguria: Secondary | ICD-10-CM | POA: Diagnosis not present

## 2020-06-16 DIAGNOSIS — Z978 Presence of other specified devices: Secondary | ICD-10-CM

## 2020-06-16 DIAGNOSIS — J302 Other seasonal allergic rhinitis: Secondary | ICD-10-CM | POA: Diagnosis present

## 2020-06-16 DIAGNOSIS — I82402 Acute embolism and thrombosis of unspecified deep veins of left lower extremity: Secondary | ICD-10-CM

## 2020-06-16 DIAGNOSIS — Z791 Long term (current) use of non-steroidal anti-inflammatories (NSAID): Secondary | ICD-10-CM

## 2020-06-16 DIAGNOSIS — R570 Cardiogenic shock: Secondary | ICD-10-CM | POA: Diagnosis not present

## 2020-06-16 DIAGNOSIS — J9601 Acute respiratory failure with hypoxia: Secondary | ICD-10-CM

## 2020-06-16 DIAGNOSIS — F419 Anxiety disorder, unspecified: Secondary | ICD-10-CM | POA: Diagnosis present

## 2020-06-16 DIAGNOSIS — E8779 Other fluid overload: Secondary | ICD-10-CM | POA: Diagnosis not present

## 2020-06-16 DIAGNOSIS — I10 Essential (primary) hypertension: Secondary | ICD-10-CM | POA: Diagnosis not present

## 2020-06-16 DIAGNOSIS — J15212 Pneumonia due to Methicillin resistant Staphylococcus aureus: Secondary | ICD-10-CM | POA: Diagnosis present

## 2020-06-16 DIAGNOSIS — J45909 Unspecified asthma, uncomplicated: Secondary | ICD-10-CM | POA: Diagnosis present

## 2020-06-16 DIAGNOSIS — Z885 Allergy status to narcotic agent status: Secondary | ICD-10-CM

## 2020-06-16 DIAGNOSIS — Z283 Underimmunization status: Secondary | ICD-10-CM

## 2020-06-16 DIAGNOSIS — E669 Obesity, unspecified: Secondary | ICD-10-CM | POA: Diagnosis present

## 2020-06-16 DIAGNOSIS — R110 Nausea: Secondary | ICD-10-CM | POA: Diagnosis present

## 2020-06-16 DIAGNOSIS — Z79899 Other long term (current) drug therapy: Secondary | ICD-10-CM

## 2020-06-16 DIAGNOSIS — E872 Acidosis: Secondary | ICD-10-CM | POA: Diagnosis not present

## 2020-06-16 DIAGNOSIS — R0602 Shortness of breath: Secondary | ICD-10-CM

## 2020-06-16 DIAGNOSIS — Z9911 Dependence on respirator [ventilator] status: Secondary | ICD-10-CM

## 2020-06-16 DIAGNOSIS — Z833 Family history of diabetes mellitus: Secondary | ICD-10-CM

## 2020-06-16 DIAGNOSIS — Z4659 Encounter for fitting and adjustment of other gastrointestinal appliance and device: Secondary | ICD-10-CM

## 2020-06-16 DIAGNOSIS — I82442 Acute embolism and thrombosis of left tibial vein: Secondary | ICD-10-CM | POA: Diagnosis not present

## 2020-06-16 LAB — COMPREHENSIVE METABOLIC PANEL
ALT: 41 U/L (ref 0–44)
AST: 54 U/L — ABNORMAL HIGH (ref 15–41)
Albumin: 2.8 g/dL — ABNORMAL LOW (ref 3.5–5.0)
Alkaline Phosphatase: 57 U/L (ref 38–126)
Anion gap: 14 (ref 5–15)
BUN: 13 mg/dL (ref 6–20)
CO2: 20 mmol/L — ABNORMAL LOW (ref 22–32)
Calcium: 8.7 mg/dL — ABNORMAL LOW (ref 8.9–10.3)
Chloride: 106 mmol/L (ref 98–111)
Creatinine, Ser: 1.11 mg/dL — ABNORMAL HIGH (ref 0.44–1.00)
GFR calc Af Amer: >60 mL/min (ref >60)
GFR calc non Af Amer: >60 mL/min (ref >60)
Glucose, Bld: 171 mg/dL — ABNORMAL HIGH (ref 70–99)
Potassium: 3.9 mmol/L (ref 3.5–5.1)
Sodium: 140 mmol/L (ref 135–145)
Total Bilirubin: 0.6 mg/dL (ref 0.3–1.2)
Total Protein: 6.7 g/dL (ref 6.5–8.1)

## 2020-06-16 LAB — CBC WITH DIFFERENTIAL/PLATELET
Abs Immature Granulocytes: 0.07 10*3/uL (ref 0.00–0.07)
Basophils Absolute: 0 10*3/uL (ref 0.0–0.1)
Basophils Relative: 0 %
Eosinophils Absolute: 0 10*3/uL (ref 0.0–0.5)
Eosinophils Relative: 0 %
HCT: 48.9 % — ABNORMAL HIGH (ref 36.0–46.0)
Hemoglobin: 15 g/dL (ref 12.0–15.0)
Immature Granulocytes: 1 %
Lymphocytes Relative: 7 %
Lymphs Abs: 0.7 10*3/uL (ref 0.7–4.0)
MCH: 27.6 pg (ref 26.0–34.0)
MCHC: 30.7 g/dL (ref 30.0–36.0)
MCV: 90.1 fL (ref 80.0–100.0)
Monocytes Absolute: 0.4 10*3/uL (ref 0.1–1.0)
Monocytes Relative: 4 %
Neutro Abs: 9.7 10*3/uL — ABNORMAL HIGH (ref 1.7–7.7)
Neutrophils Relative %: 88 %
Platelets: 200 10*3/uL (ref 150–400)
RBC: 5.43 MIL/uL — ABNORMAL HIGH (ref 3.87–5.11)
RDW: 14.1 % (ref 11.5–15.5)
WBC: 11 10*3/uL — ABNORMAL HIGH (ref 4.0–10.5)
nRBC: 0 % (ref 0.0–0.2)

## 2020-06-16 LAB — FIBRINOGEN: Fibrinogen: 651 mg/dL — ABNORMAL HIGH (ref 210–475)

## 2020-06-16 LAB — I-STAT BETA HCG BLOOD, ED (MC, WL, AP ONLY): I-stat hCG, quantitative: <5 m[IU]/mL (ref <5)

## 2020-06-16 LAB — D-DIMER, QUANTITATIVE: D-Dimer, Quant: 0.85 ug/mL-FEU — ABNORMAL HIGH (ref 0.00–0.50)

## 2020-06-16 LAB — LACTIC ACID, PLASMA: Lactic Acid, Venous: 1 mmol/L (ref 0.5–1.9)

## 2020-06-16 LAB — LACTATE DEHYDROGENASE: LDH: 450 U/L — ABNORMAL HIGH (ref 98–192)

## 2020-06-16 LAB — TRIGLYCERIDES: Triglycerides: 129 mg/dL (ref <150)

## 2020-06-16 NOTE — ED Notes (Signed)
Pt called to this RN feeling SOB, sats 89-90% on 15L NRB, notified charge RN. No bed available for patient at this time. Will continue to monitor.

## 2020-06-16 NOTE — ED Triage Notes (Signed)
Pt from home with ems, COVID+ on Monday. Saturations in the 80s on room air. Pt arrives to triage on NRB. No acute distress noted. Pt a.o

## 2020-06-17 ENCOUNTER — Encounter (HOSPITAL_COMMUNITY): Payer: Self-pay | Admitting: Internal Medicine

## 2020-06-17 DIAGNOSIS — J45909 Unspecified asthma, uncomplicated: Secondary | ICD-10-CM | POA: Diagnosis present

## 2020-06-17 DIAGNOSIS — R7989 Other specified abnormal findings of blood chemistry: Secondary | ICD-10-CM | POA: Diagnosis not present

## 2020-06-17 DIAGNOSIS — E872 Acidosis: Secondary | ICD-10-CM | POA: Diagnosis not present

## 2020-06-17 DIAGNOSIS — E8779 Other fluid overload: Secondary | ICD-10-CM | POA: Diagnosis not present

## 2020-06-17 DIAGNOSIS — D649 Anemia, unspecified: Secondary | ICD-10-CM | POA: Diagnosis not present

## 2020-06-17 DIAGNOSIS — I82442 Acute embolism and thrombosis of left tibial vein: Secondary | ICD-10-CM | POA: Diagnosis not present

## 2020-06-17 DIAGNOSIS — A419 Sepsis, unspecified organism: Secondary | ICD-10-CM

## 2020-06-17 DIAGNOSIS — U071 COVID-19: Secondary | ICD-10-CM

## 2020-06-17 DIAGNOSIS — A4102 Sepsis due to Methicillin resistant Staphylococcus aureus: Secondary | ICD-10-CM | POA: Diagnosis present

## 2020-06-17 DIAGNOSIS — J1282 Pneumonia due to coronavirus disease 2019: Secondary | ICD-10-CM

## 2020-06-17 DIAGNOSIS — I998 Other disorder of circulatory system: Secondary | ICD-10-CM | POA: Diagnosis not present

## 2020-06-17 DIAGNOSIS — J302 Other seasonal allergic rhinitis: Secondary | ICD-10-CM | POA: Diagnosis present

## 2020-06-17 DIAGNOSIS — R34 Anuria and oliguria: Secondary | ICD-10-CM | POA: Diagnosis not present

## 2020-06-17 DIAGNOSIS — J069 Acute upper respiratory infection, unspecified: Secondary | ICD-10-CM

## 2020-06-17 DIAGNOSIS — R6521 Severe sepsis with septic shock: Secondary | ICD-10-CM | POA: Diagnosis not present

## 2020-06-17 DIAGNOSIS — I82402 Acute embolism and thrombosis of unspecified deep veins of left lower extremity: Secondary | ICD-10-CM | POA: Diagnosis not present

## 2020-06-17 DIAGNOSIS — R570 Cardiogenic shock: Secondary | ICD-10-CM | POA: Diagnosis not present

## 2020-06-17 DIAGNOSIS — R6520 Severe sepsis without septic shock: Secondary | ICD-10-CM

## 2020-06-17 DIAGNOSIS — J15212 Pneumonia due to Methicillin resistant Staphylococcus aureus: Secondary | ICD-10-CM | POA: Diagnosis present

## 2020-06-17 DIAGNOSIS — Z515 Encounter for palliative care: Secondary | ICD-10-CM | POA: Diagnosis not present

## 2020-06-17 DIAGNOSIS — N179 Acute kidney failure, unspecified: Secondary | ICD-10-CM | POA: Diagnosis present

## 2020-06-17 DIAGNOSIS — J80 Acute respiratory distress syndrome: Secondary | ICD-10-CM | POA: Diagnosis present

## 2020-06-17 DIAGNOSIS — M797 Fibromyalgia: Secondary | ICD-10-CM

## 2020-06-17 DIAGNOSIS — Z6836 Body mass index (BMI) 36.0-36.9, adult: Secondary | ICD-10-CM | POA: Diagnosis not present

## 2020-06-17 DIAGNOSIS — R739 Hyperglycemia, unspecified: Secondary | ICD-10-CM | POA: Diagnosis present

## 2020-06-17 DIAGNOSIS — Z9911 Dependence on respirator [ventilator] status: Secondary | ICD-10-CM | POA: Diagnosis not present

## 2020-06-17 DIAGNOSIS — I361 Nonrheumatic tricuspid (valve) insufficiency: Secondary | ICD-10-CM | POA: Diagnosis not present

## 2020-06-17 DIAGNOSIS — I82432 Acute embolism and thrombosis of left popliteal vein: Secondary | ICD-10-CM | POA: Diagnosis not present

## 2020-06-17 DIAGNOSIS — J4540 Moderate persistent asthma, uncomplicated: Secondary | ICD-10-CM

## 2020-06-17 DIAGNOSIS — R609 Edema, unspecified: Secondary | ICD-10-CM | POA: Diagnosis not present

## 2020-06-17 DIAGNOSIS — Z66 Do not resuscitate: Secondary | ICD-10-CM | POA: Diagnosis not present

## 2020-06-17 DIAGNOSIS — E669 Obesity, unspecified: Secondary | ICD-10-CM | POA: Diagnosis present

## 2020-06-17 DIAGNOSIS — I82452 Acute embolism and thrombosis of left peroneal vein: Secondary | ICD-10-CM | POA: Diagnosis not present

## 2020-06-17 DIAGNOSIS — A4189 Other specified sepsis: Secondary | ICD-10-CM | POA: Diagnosis present

## 2020-06-17 DIAGNOSIS — J9601 Acute respiratory failure with hypoxia: Secondary | ICD-10-CM

## 2020-06-17 DIAGNOSIS — M7989 Other specified soft tissue disorders: Secondary | ICD-10-CM | POA: Diagnosis not present

## 2020-06-17 HISTORY — DX: Unspecified asthma, uncomplicated: J45.909

## 2020-06-17 LAB — CBC WITH DIFFERENTIAL/PLATELET
Abs Immature Granulocytes: 0.06 10*3/uL (ref 0.00–0.07)
Basophils Absolute: 0 10*3/uL (ref 0.0–0.1)
Basophils Relative: 0 %
Eosinophils Absolute: 0 10*3/uL (ref 0.0–0.5)
Eosinophils Relative: 0 %
HCT: 44.7 % (ref 36.0–46.0)
Hemoglobin: 14.1 g/dL (ref 12.0–15.0)
Immature Granulocytes: 1 %
Lymphocytes Relative: 7 %
Lymphs Abs: 0.6 10*3/uL — ABNORMAL LOW (ref 0.7–4.0)
MCH: 28.1 pg (ref 26.0–34.0)
MCHC: 31.5 g/dL (ref 30.0–36.0)
MCV: 89.2 fL (ref 80.0–100.0)
Monocytes Absolute: 0.1 10*3/uL (ref 0.1–1.0)
Monocytes Relative: 2 %
Neutro Abs: 8 10*3/uL — ABNORMAL HIGH (ref 1.7–7.7)
Neutrophils Relative %: 90 %
Platelets: 196 10*3/uL (ref 150–400)
RBC: 5.01 MIL/uL (ref 3.87–5.11)
RDW: 14.1 % (ref 11.5–15.5)
WBC: 8.8 10*3/uL (ref 4.0–10.5)
nRBC: 0 % (ref 0.0–0.2)

## 2020-06-17 LAB — COMPREHENSIVE METABOLIC PANEL
ALT: 33 U/L (ref 0–44)
AST: 41 U/L (ref 15–41)
Albumin: 2.2 g/dL — ABNORMAL LOW (ref 3.5–5.0)
Alkaline Phosphatase: 46 U/L (ref 38–126)
Anion gap: 11 (ref 5–15)
BUN: 13 mg/dL (ref 6–20)
CO2: 22 mmol/L (ref 22–32)
Calcium: 8 mg/dL — ABNORMAL LOW (ref 8.9–10.3)
Chloride: 106 mmol/L (ref 98–111)
Creatinine, Ser: 1.06 mg/dL — ABNORMAL HIGH (ref 0.44–1.00)
GFR calc Af Amer: >60 mL/min (ref >60)
GFR calc non Af Amer: >60 mL/min (ref >60)
Glucose, Bld: 259 mg/dL — ABNORMAL HIGH (ref 70–99)
Potassium: 4.2 mmol/L (ref 3.5–5.1)
Sodium: 139 mmol/L (ref 135–145)
Total Bilirubin: 0.5 mg/dL (ref 0.3–1.2)
Total Protein: 5.9 g/dL — ABNORMAL LOW (ref 6.5–8.1)

## 2020-06-17 LAB — LACTATE DEHYDROGENASE: LDH: 482 U/L — ABNORMAL HIGH (ref 98–192)

## 2020-06-17 LAB — PHOSPHORUS: Phosphorus: 2 mg/dL — ABNORMAL LOW (ref 2.5–4.6)

## 2020-06-17 LAB — CBG MONITORING, ED
Glucose-Capillary: 214 mg/dL — ABNORMAL HIGH (ref 70–99)
Glucose-Capillary: 180 mg/dL — ABNORMAL HIGH (ref 70–99)
Glucose-Capillary: 171 mg/dL — ABNORMAL HIGH (ref 70–99)
Glucose-Capillary: 163 mg/dL — ABNORMAL HIGH (ref 70–99)

## 2020-06-17 LAB — C-REACTIVE PROTEIN
CRP: 19.4 mg/dL — ABNORMAL HIGH (ref <1.0)
CRP: 22 mg/dL — ABNORMAL HIGH (ref <1.0)

## 2020-06-17 LAB — MAGNESIUM: Magnesium: 1.6 mg/dL — ABNORMAL LOW (ref 1.7–2.4)

## 2020-06-17 LAB — ABO/RH: ABO/RH(D): O POS

## 2020-06-17 LAB — STREP PNEUMONIAE URINARY ANTIGEN: Strep Pneumo Urinary Antigen: NEGATIVE

## 2020-06-17 LAB — SARS CORONAVIRUS 2 BY RT PCR (HOSPITAL ORDER, PERFORMED IN ~~LOC~~ HOSPITAL LAB): SARS Coronavirus 2: POSITIVE — AB

## 2020-06-17 LAB — FERRITIN
Ferritin: 954 ng/mL — ABNORMAL HIGH (ref 11–307)
Ferritin: 1015 ng/mL — ABNORMAL HIGH (ref 11–307)

## 2020-06-17 LAB — D-DIMER, QUANTITATIVE: D-Dimer, Quant: 0.83 ug/mL-FEU — ABNORMAL HIGH (ref 0.00–0.50)

## 2020-06-17 LAB — SEDIMENTATION RATE: Sed Rate: 35 mm/hr — ABNORMAL HIGH (ref 0–22)

## 2020-06-17 LAB — GLUCOSE, CAPILLARY: Glucose-Capillary: 194 mg/dL — ABNORMAL HIGH (ref 70–99)

## 2020-06-17 LAB — FIBRINOGEN: Fibrinogen: 707 mg/dL — ABNORMAL HIGH (ref 210–475)

## 2020-06-17 LAB — PROCALCITONIN: Procalcitonin: 1.67 ng/mL

## 2020-06-17 MED ORDER — INSULIN DETEMIR 100 UNIT/ML ~~LOC~~ SOLN
0.0750 [IU]/kg | Freq: Two times a day (BID) | SUBCUTANEOUS | Status: DC
  Administered 2020-06-17 – 2020-06-21 (×9): 6 [IU] via SUBCUTANEOUS
  Filled 2020-06-17 (×11): qty 0.06

## 2020-06-17 MED ORDER — DEXAMETHASONE SODIUM PHOSPHATE 10 MG/ML IJ SOLN
10.0000 mg | Freq: Once | INTRAMUSCULAR | Status: AC
  Administered 2020-06-17: 10 mg via INTRAVENOUS
  Filled 2020-06-17: qty 1

## 2020-06-17 MED ORDER — MAGNESIUM SULFATE 2 GM/50ML IV SOLN
2.0000 g | Freq: Once | INTRAVENOUS | Status: AC
  Administered 2020-06-17: 2 g via INTRAVENOUS
  Filled 2020-06-17: qty 50

## 2020-06-17 MED ORDER — SODIUM CHLORIDE 0.9 % IV SOLN: INTRAVENOUS | Status: AC

## 2020-06-17 MED ORDER — SODIUM CHLORIDE 0.9 % IV SOLN
2.0000 g | INTRAVENOUS | Status: AC
  Administered 2020-06-17 – 2020-06-21 (×5): 2 g via INTRAVENOUS
  Filled 2020-06-17 (×5): qty 20

## 2020-06-17 MED ORDER — ONDANSETRON HCL 4 MG/2ML IJ SOLN
4.0000 mg | Freq: Once | INTRAMUSCULAR | Status: DC
  Filled 2020-06-17: qty 2

## 2020-06-17 MED ORDER — MONTELUKAST SODIUM 10 MG PO TABS
10.0000 mg | ORAL_TABLET | Freq: Every day | ORAL | Status: DC
  Administered 2020-06-17 – 2020-06-18 (×2): 10 mg via ORAL
  Filled 2020-06-17 (×2): qty 1

## 2020-06-17 MED ORDER — ONDANSETRON HCL 4 MG/2ML IJ SOLN: 4.0000 mg | Freq: Four times a day (QID) | INTRAMUSCULAR | Status: DC | PRN

## 2020-06-17 MED ORDER — ZINC SULFATE 220 (50 ZN) MG PO CAPS
220.0000 mg | ORAL_CAPSULE | Freq: Every day | ORAL | Status: DC
  Administered 2020-06-17 – 2020-06-20 (×4): 220 mg via ORAL
  Filled 2020-06-17 (×3): qty 1

## 2020-06-17 MED ORDER — LINAGLIPTIN 5 MG PO TABS
5.0000 mg | ORAL_TABLET | Freq: Every day | ORAL | Status: DC
  Administered 2020-06-17 – 2020-06-20 (×4): 5 mg via ORAL
  Filled 2020-06-17 (×3): qty 1

## 2020-06-17 MED ORDER — GUAIFENESIN-DM 100-10 MG/5ML PO SYRP
10.0000 mL | ORAL_SOLUTION | ORAL | Status: DC | PRN
  Filled 2020-06-17: qty 10

## 2020-06-17 MED ORDER — INSULIN ASPART 100 UNIT/ML ~~LOC~~ SOLN
0.0000 [IU] | Freq: Three times a day (TID) | SUBCUTANEOUS | Status: DC
  Administered 2020-06-17: 7 [IU] via SUBCUTANEOUS
  Administered 2020-06-17: 4 [IU] via SUBCUTANEOUS

## 2020-06-17 MED ORDER — ALBUTEROL SULFATE HFA 108 (90 BASE) MCG/ACT IN AERS
1.0000 | INHALATION_SPRAY | RESPIRATORY_TRACT | Status: DC | PRN
  Filled 2020-06-17: qty 6.7

## 2020-06-17 MED ORDER — ALBUTEROL SULFATE HFA 108 (90 BASE) MCG/ACT IN AERS: 2.0000 | INHALATION_SPRAY | Freq: Four times a day (QID) | RESPIRATORY_TRACT | Status: DC | PRN

## 2020-06-17 MED ORDER — VITAMIN B-12 1000 MCG PO TABS
1000.0000 ug | ORAL_TABLET | Freq: Every day | ORAL | Status: DC
  Administered 2020-06-17 – 2020-06-20 (×4): 1000 ug via ORAL
  Filled 2020-06-17 (×3): qty 1

## 2020-06-17 MED ORDER — BARICITINIB 2 MG PO TABS
4.0000 mg | ORAL_TABLET | Freq: Every day | ORAL | Status: DC
  Administered 2020-06-17 – 2020-06-20 (×4): 4 mg via ORAL
  Filled 2020-06-17 (×3): qty 2

## 2020-06-17 MED ORDER — ONDANSETRON HCL 4 MG PO TABS
4.0000 mg | ORAL_TABLET | Freq: Four times a day (QID) | ORAL | Status: DC | PRN
  Administered 2020-06-17: 4 mg via ORAL
  Filled 2020-06-17: qty 1

## 2020-06-17 MED ORDER — SODIUM CHLORIDE 0.9 % IV SOLN
200.0000 mg | Freq: Once | INTRAVENOUS | Status: AC
  Administered 2020-06-17: 200 mg via INTRAVENOUS
  Filled 2020-06-17: qty 200

## 2020-06-17 MED ORDER — FAMOTIDINE IN NACL 20-0.9 MG/50ML-% IV SOLN
20.0000 mg | Freq: Two times a day (BID) | INTRAVENOUS | Status: DC
  Administered 2020-06-17 – 2020-06-22 (×10): 20 mg via INTRAVENOUS
  Filled 2020-06-17 (×12): qty 50

## 2020-06-17 MED ORDER — METHYLPREDNISOLONE SODIUM SUCC 40 MG IJ SOLR
0.5000 mg/kg | Freq: Two times a day (BID) | INTRAMUSCULAR | Status: DC
  Administered 2020-06-17 – 2020-06-25 (×17): 40 mg via INTRAVENOUS
  Filled 2020-06-17 (×19): qty 1

## 2020-06-17 MED ORDER — DOCUSATE SODIUM 100 MG PO CAPS: 100.0000 mg | ORAL_CAPSULE | Freq: Two times a day (BID) | ORAL | Status: DC | PRN

## 2020-06-17 MED ORDER — SODIUM CHLORIDE 0.9 % IV SOLN
500.0000 mg | INTRAVENOUS | Status: AC
  Administered 2020-06-17 – 2020-06-21 (×5): 500 mg via INTRAVENOUS
  Filled 2020-06-17 (×7): qty 500

## 2020-06-17 MED ORDER — SODIUM CHLORIDE 0.9 % IV SOLN
100.0000 mg | Freq: Every day | INTRAVENOUS | Status: AC
  Administered 2020-06-18 – 2020-06-21 (×4): 100 mg via INTRAVENOUS
  Filled 2020-06-17 (×4): qty 20

## 2020-06-17 MED ORDER — PREGABALIN 100 MG PO CAPS
200.0000 mg | ORAL_CAPSULE | Freq: Every day | ORAL | Status: DC
  Administered 2020-06-17 – 2020-06-20 (×4): 200 mg via ORAL
  Filled 2020-06-17 (×3): qty 2

## 2020-06-17 MED ORDER — ONDANSETRON HCL 4 MG/2ML IJ SOLN
4.0000 mg | Freq: Once | INTRAMUSCULAR | Status: AC
  Administered 2020-06-17: 4 mg via INTRAVENOUS

## 2020-06-17 MED ORDER — ACETAMINOPHEN 650 MG RE SUPP: 650.0000 mg | Freq: Four times a day (QID) | RECTAL | Status: DC | PRN

## 2020-06-17 MED ORDER — SODIUM CHLORIDE 0.9 % IV BOLUS
500.0000 mL | Freq: Once | INTRAVENOUS | Status: AC
  Administered 2020-06-17: 500 mL via INTRAVENOUS

## 2020-06-17 MED ORDER — DEXAMETHASONE SODIUM PHOSPHATE 10 MG/ML IJ SOLN: 6.0000 mg | INTRAMUSCULAR | Status: DC

## 2020-06-17 MED ORDER — POLYETHYLENE GLYCOL 3350 17 G PO PACK: 17.0000 g | PACK | Freq: Every day | ORAL | Status: DC | PRN

## 2020-06-17 MED ORDER — FLUTICASONE FUROATE-VILANTEROL 200-25 MCG/INH IN AEPB
1.0000 | INHALATION_SPRAY | Freq: Every day | RESPIRATORY_TRACT | Status: DC
  Administered 2020-06-17 – 2020-06-19 (×2): 1 via RESPIRATORY_TRACT
  Filled 2020-06-17: qty 28

## 2020-06-17 MED ORDER — ENOXAPARIN SODIUM 40 MG/0.4ML ~~LOC~~ SOLN
40.0000 mg | Freq: Every day | SUBCUTANEOUS | Status: DC
  Administered 2020-06-17 – 2020-06-20 (×3): 40 mg via SUBCUTANEOUS
  Filled 2020-06-17 (×4): qty 0.4

## 2020-06-17 MED ORDER — ACETAMINOPHEN 325 MG PO TABS
650.0000 mg | ORAL_TABLET | Freq: Four times a day (QID) | ORAL | Status: DC | PRN
  Administered 2020-06-18 – 2020-06-19 (×2): 650 mg via ORAL
  Filled 2020-06-17 (×2): qty 2

## 2020-06-17 MED ORDER — DIPHENHYDRAMINE HCL 25 MG PO CAPS: 25.0000 mg | ORAL_CAPSULE | Freq: Four times a day (QID) | ORAL | Status: DC | PRN

## 2020-06-17 MED ORDER — ASCORBIC ACID 500 MG PO TABS
500.0000 mg | ORAL_TABLET | Freq: Every day | ORAL | Status: DC
  Administered 2020-06-17 – 2020-06-20 (×4): 500 mg via ORAL
  Filled 2020-06-17 (×3): qty 1

## 2020-06-17 MED ORDER — K PHOS MONO-SOD PHOS DI & MONO 155-852-130 MG PO TABS
500.0000 mg | ORAL_TABLET | Freq: Two times a day (BID) | ORAL | Status: AC
  Administered 2020-06-17 (×2): 500 mg via ORAL
  Filled 2020-06-17 (×3): qty 2

## 2020-06-17 MED ORDER — INSULIN ASPART 100 UNIT/ML ~~LOC~~ SOLN
0.0000 [IU] | SUBCUTANEOUS | Status: DC
  Administered 2020-06-17 – 2020-06-18 (×4): 2 [IU] via SUBCUTANEOUS
  Administered 2020-06-18: 1 [IU] via SUBCUTANEOUS
  Administered 2020-06-18 – 2020-06-19 (×5): 2 [IU] via SUBCUTANEOUS
  Administered 2020-06-19 (×4): 1 [IU] via SUBCUTANEOUS
  Administered 2020-06-20: 3 [IU] via SUBCUTANEOUS
  Administered 2020-06-20 (×2): 2 [IU] via SUBCUTANEOUS
  Administered 2020-06-20: 3 [IU] via SUBCUTANEOUS
  Administered 2020-06-20: 2 [IU] via SUBCUTANEOUS
  Administered 2020-06-21: 5 [IU] via SUBCUTANEOUS
  Administered 2020-06-21 (×4): 7 [IU] via SUBCUTANEOUS
  Administered 2020-06-21: 5 [IU] via SUBCUTANEOUS
  Administered 2020-06-22: 9 [IU] via SUBCUTANEOUS

## 2020-06-17 MED ORDER — IPRATROPIUM-ALBUTEROL 20-100 MCG/ACT IN AERS
2.0000 | INHALATION_SPRAY | Freq: Four times a day (QID) | RESPIRATORY_TRACT | Status: DC
  Administered 2020-06-17 – 2020-06-19 (×7): 2 via RESPIRATORY_TRACT
  Filled 2020-06-17: qty 4

## 2020-06-17 MED ORDER — CHLORHEXIDINE GLUCONATE CLOTH 2 % EX PADS
6.0000 | MEDICATED_PAD | Freq: Every day | CUTANEOUS | Status: DC
  Administered 2020-06-17 – 2020-06-24 (×7): 6 via TOPICAL

## 2020-06-17 NOTE — ED Notes (Signed)
Pt continues to be able to self-prone/lay on side for comfort

## 2020-06-17 NOTE — Progress Notes (Signed)
RT went to draw ABG and patient refused. RT explained the importance of ABG, and patient stated she did not want it. Just wants to go home. Explained that she was on high amounts of oxygen, and she still refused. RN aware

## 2020-06-17 NOTE — ED Notes (Signed)
Dinner ordered 

## 2020-06-17 NOTE — Progress Notes (Addendum)
Care started prior to midnight in the emergency room and patient was admitted early this morning after midnight by Dr. Tennis Must and I am in current agreement with this assessment and plan.  Additional changes to the plan of care have been made accordingly.  The patient is a 44 year old obese unvaccinated Caucasian female with a past medical history significant for but not limited to asthma, eczema, seasonal allergies, fibromyalgia currently on Lyrica who presented to the emergency department with progressive worsening dyspnea associated with decreased appetite, nausea, fatigue, malaise and myalgias after being diagnosed with COVID-19 disease on Monday.  She was exposed to her daughter's best friend who had similar symptoms.  On arrival to the emergency room she was found to be hypoxic in the 80s and denied any fevers or hemoptysis but does have a little bit of cough.  She also complains of some nausea but no vomiting.  She is currently being admitted for and treated for the following but not limited to:   Severe Sepsis, poA Acute Respiratory Failure with Hypoxia 2/2 Viral COVID-19 Pneumonia with likely superimposed bacterial PNA -SARS-CoV-2 PCR positive Monday  -She has severe parenchymal lung injury and has been started on IV steroids high-dose along with Remdesivir and Baricitinib.  -Patient met criteria for severe sepsis on admission given SIRS criteria of a respiratory rate > 20 (24), HR >90 (108), with a source of Infection (PNA) with evidence of organ dysfunction given Acute Respiratory Failure with Hypoxia -Initial CXR showed "Bilateral ground-glass opacities and patchy consolidations, consistent with bilateral pneumonia" -CTA of the Chest not done -Sepsis Physiology is improved but Respiratory Status has worsened and is tenuous -Inflammatory Marker Trend: Recent Labs    06/07/2020 2224 06/17/20 0905  DDIMER 0.85* 0.83*  FERRITIN 954* 1,015*  LDH 450* 482*  CRP 19.4* 22.0*  -TG was  129 -Fibrinogen was 651 -> 707 -ESR was 35 Lab Results  Component Value Date   SARSCOV2NAA POSITIVE (A) 06/17/2020   -PCT was 1.67 on Admission so she was started on Empiric Abx with Azithromycin and Ceftriaxone -SpO2: 93 % O2 Flow Rate (L/min): (S) 40 L/min FiO2 (%): (S) 100 %; Continue Weaning as tolerated  -Continue Remdesivir x5 days 06/20/2020 ->  -Steroids x10 days; Currently getting IV Solu-Medrol 40 mg every 12 for 10 days total; Received IV Decadron 10 mg in the ED and was started on Dexamethasone 6 mg IV but switched as above -Started Baricitinib Off Label and continuing to get it 4 mg po daily x 14 Days (Day 1/14) started 06/17/20 -> ; patient denies any known history of active diverticulitis, tuberculosis or hepatitis, understands the risks and benefits and wanted to proceed with Baricitinb treatment -CRP was grossly elevated at 19.4 -> 22.0 -Continue Airborne, contact precautions for 21 days from positive testing (since patient is admitted for COVID) -Contine Monitor inflammatory markers and LFTs.  -Encourage OOB, IS, FV -C/w Acetaminophen 650 mg po q6prn for Fever  -Enoxaparin 40 mg q24h  -Blood Cx x2 Pending -Strep Pneumo Urinary Antigen was Negative  -Added Combivent 2 puff IH q6h and Changed Albuterol to 1-2 puff IH q4hprn -Continue to monitor temperature curve and continue with acetaminophen for mild fever -Antitussives with p.o. Robitussin-DM; Will add Benzonatate 100 mg po TIDprn as well  -Continue with Zinc sulfate 220 mg p.o. daily along with vitamin C 500 mg p.o. daily -Prone if able -Has a high risk for decompensation and respiratory status is tenuous -Repeat CXR in the AM  -Continue monitor respiratory  status carefully and  wean O2 as tolerated and if worsens may need a Pulmonary Evaluation   **ADDENDUM 15:15: I called and talked to Georgann Housekeeper, NP of Pulmonary/PCCM who feels that if we can maintain her saturations above 85% Resting, there is no indication to move  her to the ICU and feels that Pulmonary does not need to get involved at this point. She continues to drop her Saturations and has been saturating on the mid 80's now on 40 Liters 100% FiO2 and has Respirations in the mid 20's. We will titrate up to 50 Liters with low Threshold to provide Invasive Mechanical Ventilation given her continued deterioration throughout the day if she cannot consistently manage her Saturations -Will need an ambulatory home O2 screen prior to discharge  Hypomagnesemia -Patient's magnesium level was 1.6 -Replete with IV mag sulfate 2 g -Continue to monitor and replete as necessary -Repeat magnesium level in a.m.  Hypophosphatemia  -Patient's phosphorus level was 2.0 -Replete with p.o. K-Phos Neutral 500 mg p.o. twice daily x2 doses -Continue monitor and replete as necessary -Repeat phosphorus level in a.m.  Leukocytosis -From Above -WBC went from 11.0 -> 8.8 -Continue to Monitor and Trend and expect to worsen in the setting of IV Steroid Demargination -Repeat CBC in the AM   Hyperglycemia -Has family history. -Check hemoglobin A1c. -Carbohydrate modified diet. -CBG monitoring with resistant sliding scale insulin AC while on glucocorticoids.  Asthma -Continue Breo Ellipta 200-25 mcg/INH 1 puff IH Daily  -Continue Montelukast 10 mg p.o. daily -Continue Albuterol MDI 1-2 puff IH q4hprn as needed.  Fibromyalgia -Continue Pregabalin 200 mg p.o. daily.  Seasonal Allergies/Allergic Rhinitis -Continue Montelukast 10 mg p.o. daily  Nausea -From Above -C/w Supportive Care and C/w Antiemetics with Ondansetron 4 mg po/IV q6hprn Nausea  We will continue to monitor the patient's clinical response to intervention and repeat blood work and imaging in the a.m.

## 2020-06-17 NOTE — ED Provider Notes (Signed)
[facility] EMERGENCY DEPARTMENT Provider Note   CSN: [id number] Arrival date & time: 05/29/2020  5465     History Chief Complaint  Patient presents with  . COVID+  . Shortness of Breath    Lisa [name] is a 44 y.o. female.  The history is provided by the patient and medical records.  Shortness of Breath Associated symptoms: cough     44 year old female with history of fibromyalgia, seasonal allergies, asthma, eczema, presenting to the ED with shortness of breath.  She was diagnosed Covid positive on Monday.  She had exposure from her daughter's best friend who was recently in the home.  States she is the only one that is currently ill.  She was seen yesterday in [facility] and noted to have O2 saturations in the 80s.  She was given several puffs of an albuterol inhaler and saturations improved so she proceeded with infusion.  Later last evening at home has been noticed her breathing seemed a little labored and shallow and that she was pale in color.  She checked her O2 with home pulse ox and sats were in the 70s.  She was transported here for further evaluation.  States she is not really been eating and drinking due to nausea.  She has not been yet vaccinated for COVID-19, was planning to do this over Labor Day weekend.  Past Medical History:  Diagnosis Date  . Eczema   . Fibromyalgia    lyrica-pain in legs and arms-controlled  . Seasonal allergies     Patient Active Problem List   Diagnosis Date Noted  . Menorrhagia 09/03/2012    Class: Hospitalized for    Past Surgical History:  Procedure Laterality Date  . DILATION AND CURETTAGE OF UTERUS  2012  . LAPAROSCOPIC ASSISTED VAGINAL HYSTERECTOMY  09/02/2012   Procedure: LAPAROSCOPIC ASSISTED VAGINAL HYSTERECTOMY;  Surgeon: Darlyn Chamber, MD;  Location: [facility];  Service: Gynecology;  Laterality: N/A;  with cystoscopy  . LIPOMA EXCISION     local  . MOUTH SURGERY  2013  . NASAL SINUS SURGERY    .  SEPTOPLASTY    . TUBAL LIGATION    . WISDOM TOOTH EXTRACTION       OB History   No obstetric history on file.     No family history on file.  Social History   Tobacco Use  . Smoking status: Never Smoker  Substance Use Topics  . Alcohol use: Yes    Comment: rare  . Drug use: No    Home Medications Prior to Admission medications   Medication Sig Start Date End Date Taking? Authorizing Provider  diphenhydrAMINE (BENADRYL) 25 mg capsule Take 25 mg by mouth every 6 (six) hours as needed. For allergies    [provider]  fluticasone (FLONASE) 50 MCG/ACT nasal spray Place 2 sprays into the nose daily.    [provider]  pregabalin (LYRICA) 100 MG capsule Take 100 mg by mouth daily.    [provider]    Allergies    Effexor [venlafaxine hcl], Percocet [oxycodone-acetaminophen], and Vicodin [hydrocodone-acetaminophen]  Review of Systems   Review of Systems  Respiratory: Positive for cough and shortness of breath.   Gastrointestinal: Positive for nausea.  All other systems reviewed and are negative.   Physical Exam Updated Vital Signs BP 98/73 (BP Location: Right Arm)   Pulse 100   Temp 98.4 F (36.9 C) (Oral)   Resp (!) 24   SpO2 90%   Physical  Exam Vitals and nursing note reviewed.  Constitutional:      Appearance: She is well-developed.  HENT:     Head: Normocephalic and atraumatic.  Eyes:     Conjunctiva/sclera: Conjunctivae normal.     Pupils: Pupils are equal, round, and reactive to light.  Cardiovascular:     Rate and Rhythm: Normal rate and regular rhythm.     Heart sounds: Normal heart sounds.  Pulmonary:     Effort: Pulmonary effort is normal.     Breath sounds: Rhonchi present.     Comments: Coarse breath sounds throughout, O2 sats 90-91% during exam on NRB, speaking in sentences Abdominal:     General: Bowel sounds are normal.     Palpations: Abdomen is soft.  Musculoskeletal:        General: Normal range of motion.       Cervical back: Normal range of motion.  Skin:    General: Skin is warm and dry.  Neurological:     Mental Status: She is alert and oriented to person, place, and time.     ED Results / Procedures / Treatments   Labs (all labs ordered are listed, but only abnormal results are displayed) Labs Reviewed  CBC WITH DIFFERENTIAL/PLATELET - Abnormal; Notable for the following components:      Result Value   WBC 11.0 (*)    RBC 5.43 (*)    HCT 48.9 (*)    Neutro Abs 9.7 (*)    All other components within normal limits  COMPREHENSIVE METABOLIC PANEL - Abnormal; Notable for the following components:   CO2 20 (*)    Glucose, Bld 171 (*)    Creatinine, Ser 1.11 (*)    Calcium 8.7 (*)    Albumin 2.8 (*)    AST 54 (*)    All other components within normal limits  D-DIMER, QUANTITATIVE (NOT AT [facility]) - Abnormal; Notable for the following components:   D-Dimer, Quant 0.85 (*)    All other components within normal limits  LACTATE DEHYDROGENASE - Abnormal; Notable for the following components:   LDH 450 (*)    All other components within normal limits  FERRITIN - Abnormal; Notable for the following components:   Ferritin 954 (*)    All other components within normal limits  FIBRINOGEN - Abnormal; Notable for the following components:   Fibrinogen 651 (*)    All other components within normal limits  C-REACTIVE PROTEIN - Abnormal; Notable for the following components:   CRP 19.4 (*)    All other components within normal limits  SARS CORONAVIRUS 2 BY RT PCR (HOSPITAL ORDER, [facility])  CULTURE, BLOOD (ROUTINE X 2)  CULTURE, BLOOD (ROUTINE X 2)  LACTIC ACID, PLASMA  PROCALCITONIN  TRIGLYCERIDES  STREP PNEUMONIAE URINARY ANTIGEN  I-STAT BETA HCG BLOOD, ED ([facility], [facility], [facility] ONLY)  ABO/RH    EKG EKG Interpretation  Date/Time:  Thursday June 17 2020 02:16:36 EDT Ventricular Rate:  98 PR Interval:    QRS Duration: 80 QT Interval:  332 QTC  Calculation: 424 R Axis:   96 Text Interpretation: Sinus rhythm Borderline short PR interval Borderline right axis deviation Borderline T abnormalities, diffuse leads No previous ECGs available Confirmed by Ripley Fraise [phone]) on 06/17/2020 2:25:10 AM   Radiology DG Chest Port 1 View  Result Date: 06/13/2020 CLINICAL DATA:  Shortness of breath, COVID EXAM: PORTABLE CHEST 1 VIEW COMPARISON:  None. FINDINGS: Low lung volumes. Fairly widespread bilateral ground-glass opacities and patchy consolidations.  Normal heart size. No pneumothorax. IMPRESSION: Bilateral ground-glass opacities and patchy consolidations, consistent with bilateral pneumonia Electronically Signed   By: Donavan Foil M.D.   On: 06/14/2020 21:42    Procedures Procedures (including critical care time)  CRITICAL CARE Performed by: Larene Pickett   Total critical care time: 35 minutes  Critical care time was exclusive of separately billable procedures and treating other patients.  Critical care was necessary to treat or prevent imminent or life-threatening deterioration.  Critical care was time spent personally by me on the following activities: development of treatment plan with patient and/or surrogate as well as nursing, discussions with consultants, evaluation of patient's response to treatment, examination of patient, obtaining history from patient or surrogate, ordering and performing treatments and interventions, ordering and review of laboratory studies, ordering and review of radiographic studies, pulse oximetry and re-evaluation of patient's condition.   Medications Ordered in ED Medications  remdesivir 200 mg in sodium chloride 0.9% 250 mL IVPB (has no administration in time range)    Followed by  remdesivir 100 mg in sodium chloride 0.9 % 100 mL IVPB (has no administration in time range)  ondansetron (ZOFRAN) injection 4 mg (4 mg Intravenous Not Given 06/17/20 0244)  cefTRIAXone (ROCEPHIN) 2 g in sodium  chloride 0.9 % 100 mL IVPB (has no administration in time range)  azithromycin (ZITHROMAX) 500 mg in sodium chloride 0.9 % 250 mL IVPB (has no administration in time range)  ascorbic acid (VITAMIN C) tablet 500 mg (has no administration in time range)  zinc sulfate capsule 220 mg (has no administration in time range)  guaiFENesin-dextromethorphan (ROBITUSSIN DM) 100-10 MG/5ML syrup 10 mL (has no administration in time range)  dexamethasone (DECADRON) injection 6 mg (has no administration in time range)  Ipratropium-Albuterol (COMBIVENT) respimat 2 puff (has no administration in time range)  enoxaparin (LOVENOX) injection 40 mg (has no administration in time range)  acetaminophen (TYLENOL) tablet 650 mg (has no administration in time range)    Or  acetaminophen (TYLENOL) suppository 650 mg (has no administration in time range)  ondansetron (ZOFRAN) tablet 4 mg (has no administration in time range)    Or  ondansetron (ZOFRAN) injection 4 mg (has no administration in time range)  dexamethasone (DECADRON) injection 10 mg (10 mg Intravenous Given 06/17/20 0226)  ondansetron (ZOFRAN) injection 4 mg (4 mg Intravenous Given 06/17/20 0244)    ED Course  I have reviewed the triage vital signs and the nursing notes.  Pertinent labs & imaging results that were available during my care of the patient were reviewed by me and considered in my medical decision making (see chart for details).    MDM Rules/Calculators/A&P  44 year old female presenting to the ED with shortness of breath.  Diagnosed with COVID-19 on Monday, received MAB infusion Tuesday.  Yesterday evening she began feeling increasingly short of breath, O2 saturations on home pulse ox was 70%.  On my exam, she is on nonrebreather and maintaining sats of 90 to 91%.  She has diffuse rhonchi, but is able to speak in sentences.  She does report some nausea but denies vomiting.  She is hemodynamically stable.   Labs grossly reassuring.  Chest x-ray  with diffuse infiltrates consistent with COVID-19.  Given her significant oxygen requirement, she will require admission.  Given dose of Decadron and remdesivir.  Spoke with hospitalist, Dr. Merton Border will admit for ongoing care.  Final Clinical Impression(s) / ED Diagnoses Final diagnoses:  [id number]    Rx / DC Orders ED  Discharge Orders    None       Kathryne Hitch 06/17/20 0327    Ripley Fraise, MD 06/17/20 [phone]

## 2020-06-17 NOTE — Progress Notes (Signed)
eLink Physician-Brief Progress Note Patient Name: Lisa [name] DOB: 12-26-1975 MRN: [id number]   Date of Service  06/17/2020  HPI/Events of Note  Hypoxia - Sat = 81% on 50L/min HFNC + NrBM. Patient is asleep, therefore, may have some element of OSA as well as COVID.  eICU Interventions  Plan: 1. BiPAP via ventilator circuit. 2. ABG at 1 AM.  3. Will ask ground team to evaluate the patient at bedside.      Intervention Category Major Interventions: Hypoxemia - evaluation and management  Ansh Fauble Cornelia Copa 06/17/2020, 11:36 PM

## 2020-06-17 NOTE — H&P (Signed)
History and Physical    Lisa [name] BPZ:[id number] DOB: Feb 27, 1976 DOA: 06/08/2020  PCP: Myrlene Broker, MD   Patient coming from: Home.  I have personally briefly reviewed patient's old medical records in [place]  Chief Complaint: Shortness of breath.  HPI: Lisa [name] is a 44 y.o. female with medical history significant of asthma, eczema, seasonal allergies, fibromyalgia on Lyrica who is coming to the emergency department with complaints of progressively worse dyspnea associated with decreased appetite, nausea, fatigue, malaise and myalgias since Monday when she was diagnosed with COVID-19 after being exposed to her daughter's best friend who has similar symptoms.  Patient was found to be hypoxic in the 80s on arrival to the ED.  She denies fever, complaints chills and night sweats.  Has been having a dry cough, but no hemoptysis, rhinorrhea or sore throat.  She denies chest pain, palpitations, diaphoresis, PND, orthopnea or pitting edema of the lower extremities.  Positive nausea, but no abdominal pain, emesis, diarrhea, constipation, melena or hematochezia.  No dysuria, frequency or hematuria.  No polyuria, polydipsia, polyphagia or blurred vision.  ED Course: Initial vital signs were temperature  CBC showed a white count of 11.0 with 88% neutrophils, hemoglobin 15.0 g/dL platelets 200.  CMP shows a CO2 of 20 mmol/L, all other electrolytes are within normal range when calcium is corrected to albumin.  Glucose 171, BUN 13 and creatinine 1.11 mg/dL.  Lactic acid was normal.  Ferritin was 954, CRP 19.4, D-dimer 0.85 and fibrinogen 651.  Procalcitonin was 1.67 ng/mL.  Her chest radiograph showed bilateral groundglass opacities and patchy consolidations.  Review of Systems: As per HPI otherwise all other systems reviewed and are negative.  Past Medical History:  Diagnosis Date  . Asthma 06/17/2020  . Eczema   . Fibromyalgia    lyrica-pain in legs and arms-controlled  .  Seasonal allergies     Past Surgical History:  Procedure Laterality Date  . DILATION AND CURETTAGE OF UTERUS  2012  . LAPAROSCOPIC ASSISTED VAGINAL HYSTERECTOMY  09/02/2012   Procedure: LAPAROSCOPIC ASSISTED VAGINAL HYSTERECTOMY;  Surgeon: Darlyn Chamber, MD;  Location: [facility];  Service: Gynecology;  Laterality: N/A;  with cystoscopy  . LIPOMA EXCISION     local  . MOUTH SURGERY  2013  . NASAL SINUS SURGERY    . SEPTOPLASTY    . TUBAL LIGATION    . WISDOM TOOTH EXTRACTION     Social History  reports that she has never smoked. She does not have any smokeless tobacco history on file. She reports current alcohol use. She reports that she does not use drugs.  Allergies  Allergen Reactions  . Effexor [Venlafaxine Hcl] Other (See Comments)    Pt becomes suicidal  . Percocet [Oxycodone-Acetaminophen] Nausea And Vomiting  . Vicodin [Hydrocodone-Acetaminophen] Nausea And Vomiting   Family medical history Mother has a history of diabetes.  Prior to Admission medications   Medication Sig Start Date End Date Taking? Authorizing Provider  albuterol (VENTOLIN HFA) 108 (90 Base) MCG/ACT inhaler Inhale into the lungs every 6 (six) hours as needed for wheezing or shortness of breath.   Yes [provider]  celecoxib (CELEBREX) 200 MG capsule Take 200 mg by mouth 2 (two) times daily.   Yes [provider]  diphenhydrAMINE (BENADRYL) 25 mg capsule Take 25 mg by mouth every 6 (six) hours as needed. For allergies   Yes [provider]  ergocalciferol (VITAMIN D2) 1.25 MG (50000 UT) capsule Take 50,000 Units  by mouth every Tuesday.    Yes [provider]  fluticasone furoate-vilanterol (BREO ELLIPTA) 200-25 MCG/INH AEPB Inhale 1 puff into the lungs daily.   Yes [provider]  montelukast (SINGULAIR) 10 MG tablet Take 10 mg by mouth daily.    Yes [provider]  pregabalin (LYRICA) 200 MG capsule Take 200 mg by mouth at bedtime.    Yes [provider]  vitamin B-12 (CYANOCOBALAMIN) 1000 MCG tablet Take 1,000 mcg by mouth every other day.    Yes [provider]    Physical Exam: Vitals:   06/08/2020 1849 06/03/2020 2006 06/17/20 0213 06/17/20 0324  BP: 98/69  98/73   Pulse: (!) 136 (!) 125 100 94  Resp: 20 20 (!) 24 20  Temp: 99.5 F (37.5 C)  98.4 F (36.9 C) 98.1 F (36.7 C)  TempSrc: Oral  Oral Oral  SpO2: 92% (!) 89% 90% 94%    Constitutional: Looks acutely ill, but comfortable with NRB oxygen at this time. Eyes: PERRL, lids and conjunctivae injected. ENMT: NRB mask on.  Mucous membranes are dry. Posterior pharynx clear of any exudate or lesions. Neck: normal, supple, no masses, no thyromegaly Respiratory: Tachypneic in the mid 20s with bilateral scattered crackles, no wheezing, no crackles.  No accessory muscle use.  Cardiovascular: Regular rate and rhythm, no murmurs / rubs / gallops. No extremity edema. 2+ pedal pulses. No carotid bruits.  Abdomen: Obese, nondistended.  BS positive.  Soft, no tenderness, no masses palpated. No hepatosplenomegaly. Musculoskeletal: no clubbing / cyanosis. Good ROM, no contractures. Normal muscle tone.  Skin: no rashes, lesions, ulcers on limited dermatological examination. Neurologic: CN 2-12 grossly intact. Sensation intact, DTR normal. Strength 5/5 in all 4.  Psychiatric: Normal judgment and insight. Alert and oriented x 3. Normal mood.   Labs on Admission: I have personally reviewed following labs and imaging studies  CBC: Recent Labs  Lab 05/30/2020 2224  WBC 11.0*  NEUTROABS 9.7*  HGB 15.0  HCT 48.9*  MCV 90.1  PLT 229    Basic Metabolic Panel: Recent Labs  Lab 05/31/2020 2224  NA 140  K 3.9  CL 106  CO2 20*  GLUCOSE 171*  BUN 13  CREATININE 1.11*  CALCIUM 8.7*    GFR: CrCl cannot be calculated (Unknown ideal weight.).  Liver Function Tests: Recent Labs  Lab 06/20/2020 2224  AST 54*  ALT 41  ALKPHOS 57  BILITOT 0.6  PROT 6.7  ALBUMIN 2.8*     Urine analysis: No results found for: COLORURINE, APPEARANCEUR, LABSPEC, PHURINE, GLUCOSEU, HGBUR, BILIRUBINUR, KETONESUR, PROTEINUR, UROBILINOGEN, NITRITE, LEUKOCYTESUR  Radiological Exams on Admission: DG Chest Port 1 View  Result Date: 05/23/2020 CLINICAL DATA:  Shortness of breath, COVID EXAM: PORTABLE CHEST 1 VIEW COMPARISON:  None. FINDINGS: Low lung volumes. Fairly widespread bilateral ground-glass opacities and patchy consolidations. Normal heart size. No pneumothorax. IMPRESSION: Bilateral ground-glass opacities and patchy consolidations, consistent with bilateral pneumonia Electronically Signed   By: Donavan Foil M.D.   On: 06/05/2020 21:42    EKG: Independently reviewed. Vent. rate 98 BPM PR interval * ms QRS duration 80 ms QT/QTc 332/424 ms P-R-T axes 42 96 -11 Sinus rhythm Borderline short PR interval Borderline right axis deviation Borderline T abnormalities, diffuse leads  Assessment/Plan Principal Problem:   Pneumonia due to COVID-19 virus Admit to progressive unit/inpatient. Continue supplemental oxygen. Prone positioning encouraged. Acetaminophen as needed. Scheduled and as needed bronchodilators. antitussive as needed. Continue dexamethasone 6 mg IV daily. Remdesivir per pharmacy. Azithromycin/ceftriaxone  due to elevated procalcitonin. Follow-up CBC, CMP and inflammatory markers.  Active Problems:   Hyperglycemia No history or symptoms of diabetes. However has family history. Check hemoglobin A1c. Carbohydrate modified diet. CBG monitoring with RI SS while on glucocorticoids.    Asthma Continue Breo Ellipta. Continue Singulair. Continue albuterol MDI as needed.    Fibromyalgia Continue Lyrica 200 mg p.o. daily.    Seasonal allergies Continue Singulair 10 mg p.o. daily.    DVT prophylaxis:       Lovenox SQ. Code Status:   Full code. Family Communication:   Disposition Plan:   Patient is from:  Home.  Anticipated DC  to:  Home.  Anticipated DC date:  06/19/2020.  Anticipated DC barriers: Clinical status. Consults called:   Admission status:  Inpatient/progressive unit.  Severity of Illness: High to very high due to COVID-19 with rapidly progressing oxygen requirement on a patient with a history of asthma.    Reubin Milan MD [facility]  How to contact the [facility] Attending or Consulting provider [place] or covering provider during after hours [place], for this patient?   1. Check the care team in [facility] and look for a) attending/consulting TR[facility] provider listed and b) the [facility] team listed 2. Log into [email or web address] and use [place]'s universal password to access. If you do not have the password, please contact the hospital operator. 3. Locate the [facility] provider you are looking for under [facility] and page to a number that you can be directly reached. 4. If you still have difficulty reaching the provider, please page the [facility] (Director on Call) for the Hospitalists listed on amion for assistance.  06/17/2020, 4:58 AM   This document was prepared using Dragon voice recognition software and may contain some unintended transcription errors.

## 2020-06-17 NOTE — Progress Notes (Signed)
Patient started on heated HFNC at this time. 40L and 100%, SAT 93%. RN aware

## 2020-06-17 NOTE — Progress Notes (Addendum)
Referred to bedside by [place] physician to evaluate pt  44 yo F with COVID-19 PNA, transferred to ICU earlier today for hypoxia/excalating O2 needs.  Referred to bedside tonight for hypoxia-- SpO2 81%  Presently on 50L/min HFNC and NRB, comfortable, reclined in bed NAD. Even and unlabored respirations, no accessory use. AAOx4.    Assessment  Covid-19 PNA with hypoxic respiratory failure -seems like "happy hypoxic" patient at present although this trajectory of worsening hypoxia is concerning P Discussed self-proning with patient who feels this causes back pain and thus declines, but is amiable to rotating side lying positions Will trial BiPAP  F/u abg  Eliseo Gum MSN, AGACNP-BC [place] [phone] If no answer, [phone] 06/17/2020, 11:51 PM

## 2020-06-17 NOTE — ED Notes (Signed)
Ordered breakfast 

## 2020-06-17 NOTE — ED Notes (Signed)
Pt unable to self prone d/t nausea. Given 4 zofran IV

## 2020-06-17 NOTE — Progress Notes (Addendum)
Oxygen saturation noted at 81% despite being on HFNC and NRB at max settings and self-proned. Patient is not in any respiratory distress and resting comfortably. RT and E-link made aware.

## 2020-06-17 NOTE — Progress Notes (Signed)
Placed patient on 15L salter per RN request so patient could attempt to eat breakfast. SAT 86-87%. NRB left hooked up at bedside.

## 2020-06-17 NOTE — ED Notes (Signed)
C/o nausea medication given

## 2020-06-17 NOTE — Consult Note (Signed)
NAME:  Lisa [name], MRN:  [id number], DOB:  November 20, 1975, LOS: 0 ADMISSION DATE:  05/29/2020, CONSULTATION DATE: 06/17/2020 REFERRING MD: Dr. Aleene Davidson, CHIEF COMPLAINT: COVID-19 pneumonia  Brief History   44 year old female, no significant past medical history admitted for bilateral pneumonia, COVID-19 positive.  Pulmonary critical care consulted for evaluation as she is on 100% heated high flow 40 L.  History of present illness   This is a 44 year old female with no significant past medical history except for asthma, BMI 30, recent sick contact, daughter testing positive for COVID-19.  Patient was diagnosed on Monday symptoms started last weekend after known sick contact with patient's daughter who tested positive she was found to be hypoxic in the emergency department.  Initial labs revealed elevated white count of 11,000, initial CRP of 22, D-dimer of 0.85, glucose 171, serum creatinine of 1.  Patient has had progressive hypoxia in the ER today.  Had escalating delivery mechanisms for oxygen.  At this point on heated high flow nasal cannula at 100% and 40 L.  She feels comfortable breathing on this.  She does not feel labored.  She is able to communicate in full sentences.  However O2 sats dropped into the high 70s.  If she rests comfortably her O2 sats maintained at about 88%.  We discussed the utility of self proning.  Patient was self proned in the emergency department on heated high flow and her O2 sats came up to 93%.  Past Medical History   Past Medical History:  Diagnosis Date  . Asthma 06/17/2020  . Eczema   . Fibromyalgia    lyrica-pain in legs and arms-controlled  . Seasonal allergies      Significant Hospital Events   ICU admission  Consults:  Pulmonary critical care  Procedures:    Significant Diagnostic Tests:  Chest x-ray: Bilateral patchy airspace disease concerning for pneumonia.  Micro Data:  COVID-19 PCR: Positive  Antimicrobials:  None  Interim  history/subjective:  Per HPI above  Objective   Blood pressure 106/71, pulse 89, temperature 98.1 F (36.7 C), resp. rate (!) 23, weight 80 kg, SpO2 (!) 87 %.     Body mass index is 30.51 kg/m.   FiO2 (%):  [100 %] 100 %   Intake/Output Summary (Last 24 hours) at 06/17/2020 1645 Last data filed at 06/17/2020 1057 Gross per 24 hour  Intake --  Output 350 ml  Net -350 ml   Filed Weights   06/17/20 0900  Weight: 80 kg    Examination: General: Young female, resting in bed no distress on heated high flow nasal cannula, however tachypneic HENT: NCAT, sclera clear tracking appropriately Lungs: Bibasilar inspiratory crackles no wheeze Cardiovascular: Regular rate rhythm, S1-S2 Abdomen: Abdomen soft nontender nondistended Extremities: Edema, no rash Neuro: Moves all 4 extremities on command no deficit GU: Deferred  Resolved Hospital Problem list     Assessment & Plan:   Acute hypoxemic respiratory failure requiring heated high flow nasal cannula at 100% FiO2 and 40 L COVID-19 pneumonia, bilateral  Severe hypoxemia Plan: Continue COVID-19 treatments to include remdesivir plus steroids plus baricitinib. Self proning Continue to wean FiO2 as tolerated. I am worried about her rapidly progressive needs for oxygen.  I think she is best suited for close observation in the intensive care unit. High risk for need for intubation.  Elevated Covid inflammatory markers Elevated serum creatinine, 1  Asthma: -Continue on IV steroids at this time   Best practice:  Diet: Clear liquids Pain/Anxiety/Delirium protocol (if  indicated): Not applicable VAP protocol (if indicated): Not applicable DVT prophylaxis: Lovenox GI prophylaxis: H2 blocker Glucose control: Covid glycemic protocol Mobility: Bedrest, self proning Code Status: Full code Family Communication: Family to be updated, Disposition: ICU  Labs   CBC: Recent Labs  Lab 06/08/2020 2224 06/17/20 0905  WBC 11.0* 8.8   NEUTROABS 9.7* 8.0*  HGB 15.0 14.1  HCT 48.9* 44.7  MCV 90.1 89.2  PLT 200 161    Basic Metabolic Panel: Recent Labs  Lab 05/27/2020 2224 06/17/20 0905  NA 140 139  K 3.9 4.2  CL 106 106  CO2 20* 22  GLUCOSE 171* 259*  BUN 13 13  CREATININE 1.11* 1.06*  CALCIUM 8.7* 8.0*  MG  --  1.6*  PHOS  --  2.0*   GFR: CrCl cannot be calculated (Unknown ideal weight.). Recent Labs  Lab 05/23/2020 2224 06/17/20 0905  PROCALCITON 1.67  --   WBC 11.0* 8.8  LATICACIDVEN 1.0  --     Liver Function Tests: Recent Labs  Lab 06/20/2020 2224 06/17/20 0905  AST 54* 41  ALT 41 33  ALKPHOS 57 46  BILITOT 0.6 0.5  PROT 6.7 5.9*  ALBUMIN 2.8* 2.2*   No results for input(s): LIPASE, AMYLASE in the last 168 hours. No results for input(s): AMMONIA in the last 168 hours.  ABG No results found for: PHART, PCO2ART, PO2ART, HCO3, TCO2, ACIDBASEDEF, O2SAT   Coagulation Profile: No results for input(s): INR, PROTIME in the last 168 hours.  Cardiac Enzymes: No results for input(s): CKTOTAL, CKMB, CKMBINDEX, TROPONINI in the last 168 hours.  HbA1C: No results found for: HGBA1C  CBG: Recent Labs  Lab 06/17/20 0830 06/17/20 1258  GLUCAP 214* 180*    Review of Systems:   Review of Systems  Constitutional: Negative for chills, fever, malaise/fatigue and weight loss.  HENT: Negative for hearing loss, sore throat and tinnitus.   Eyes: Negative for blurred vision and double vision.  Respiratory: Positive for cough, sputum production and shortness of breath. Negative for hemoptysis, wheezing and stridor.   Cardiovascular: Negative for chest pain, palpitations, orthopnea, leg swelling and PND.  Gastrointestinal: Negative for abdominal pain, constipation, diarrhea, heartburn, nausea and vomiting.  Genitourinary: Negative for dysuria, hematuria and urgency.  Musculoskeletal: Negative for joint pain and myalgias.  Skin: Negative for itching and rash.  Neurological: Negative for dizziness,  tingling, weakness and headaches.  Endo/Heme/Allergies: Negative for environmental allergies. Does not bruise/bleed easily.  Psychiatric/Behavioral: Negative for depression. The patient is not nervous/anxious and does not have insomnia.   All other systems reviewed and are negative.    Past Medical History  She,  has a past medical history of Asthma (06/17/2020), Eczema, Fibromyalgia, and Seasonal allergies.   Surgical History    Past Surgical History:  Procedure Laterality Date  . DILATION AND CURETTAGE OF UTERUS  2012  . LAPAROSCOPIC ASSISTED VAGINAL HYSTERECTOMY  09/02/2012   Procedure: LAPAROSCOPIC ASSISTED VAGINAL HYSTERECTOMY;  Surgeon: Darlyn Chamber, MD;  Location: [facility];  Service: Gynecology;  Laterality: N/A;  with cystoscopy  . LIPOMA EXCISION     local  . MOUTH SURGERY  2013  . NASAL SINUS SURGERY    . SEPTOPLASTY    . TUBAL LIGATION    . WISDOM TOOTH EXTRACTION       Social History   reports that she has never smoked. She does not have any smokeless tobacco history on file. She reports current alcohol use. She reports that she does not use drugs.  Family History   Her family history includes Diabetes Mellitus II in her mother.   Allergies Allergies  Allergen Reactions  . Effexor [Venlafaxine Hcl] Other (See Comments)    Pt becomes suicidal  . Percocet [Oxycodone-Acetaminophen] Nausea And Vomiting  . Vicodin [Hydrocodone-Acetaminophen] Nausea And Vomiting     Home Medications  Prior to Admission medications   Medication Sig Start Date End Date Taking? Authorizing Provider  albuterol (VENTOLIN HFA) 108 (90 Base) MCG/ACT inhaler Inhale into the lungs every 6 (six) hours as needed for wheezing or shortness of breath.   Yes [provider]  celecoxib (CELEBREX) 200 MG capsule Take 200 mg by mouth 2 (two) times daily.   Yes [provider]  diphenhydrAMINE (BENADRYL) 25 mg capsule Take 25 mg by mouth every 6 (six) hours as needed. For allergies    Yes [provider]  ergocalciferol (VITAMIN D2) 1.25 MG (50000 UT) capsule Take 50,000 Units by mouth every Tuesday.    Yes [provider]  fluticasone furoate-vilanterol (BREO ELLIPTA) 200-25 MCG/INH AEPB Inhale 1 puff into the lungs daily.   Yes [provider]  montelukast (SINGULAIR) 10 MG tablet Take 10 mg by mouth daily.    Yes [provider]  pregabalin (LYRICA) 200 MG capsule Take 200 mg by mouth at bedtime.    Yes [provider]  vitamin B-12 (CYANOCOBALAMIN) 1000 MCG tablet Take 1,000 mcg by mouth every other day.    Yes [provider]     This patient is critically ill with multiple organ system failure; which, requires frequent high complexity decision making, assessment, support, evaluation, and titration of therapies. This was completed through the application of advanced monitoring technologies and extensive interpretation of multiple databases. During this encounter critical care time was devoted to patient care services described in this note for 38 minutes.  Garner Nash, DO [facility] 06/17/2020 5:25 PM

## 2020-06-17 NOTE — Consult Note (Signed)
ECMO Consult Note   Called to ED for ECMO Consult at (time)16:45 by June Leap, DO  Admitting Diagnosis- covid 19 viral pneumonia, acute hypoxic respiratory failure Primary Issue- acute respiratory failure Age:44 y.o. Weight: 80kg  Days on Mechanical Ventilation- 0  MAP FiO2 Oxygen Index P/F Ratio         Vasopressors no   MSOF No   RESP score (VV-ECMO) : 6  [email or web address]    Recent Blood Gas: No results found for: PHART, PCO2ART, PO2ART, HCO3, TCO2, ACIDBASEDEF, O2SAT  Coags:    Component Value Date/Time   FIBRINOGEN 707 (H) 06/17/2020 0905    CBC    Component Value Date/Time   WBC 8.8 06/17/2020 0905   RBC 5.01 06/17/2020 0905   HGB 14.1 06/17/2020 0905   HCT 44.7 06/17/2020 0905   PLT 196 06/17/2020 0905   MCV 89.2 06/17/2020 0905   MCH 28.1 06/17/2020 0905   MCHC 31.5 06/17/2020 0905   RDW 14.1 06/17/2020 0905   LYMPHSABS 0.6 (L) 06/17/2020 0905   MONOABS 0.1 06/17/2020 0905   EOSABS 0.0 06/17/2020 0905   BASOSABS 0.0 06/17/2020 0905    BMET    Component Value Date/Time   NA 139 06/17/2020 0905   K 4.2 06/17/2020 0905   CL 106 06/17/2020 0905   CO2 22 06/17/2020 0905   GLUCOSE 259 (H) 06/17/2020 0905   BUN 13 06/17/2020 0905   CREATININE 1.06 (H) 06/17/2020 0905   CALCIUM 8.0 (L) 06/17/2020 0905   GFRNONAA >60 06/17/2020 0905   GFRAA >60 06/17/2020 0905                                                                                                                                                             ECMO physician Ernest Mallick, DO  notified at 16:45 (time) Candidate meets ECMO Criteria- Yes  Placed on ECMO watch at 6:34 PM (time). Coordinator notified.  Additional notes- Hyperglycemia, unknown baseline DM status (may be new diagnosis) Reason for Consult: acute hypoxic respiratory failure due to covid-19, rapidly progressive Referring Physician: June Leap, DO  ZALEA PETE is an 44 y.o. female.  HPI: Sick x6  days  with myalgias, HA, fatigue. Diagnosed with covid 8/23. SOB began today.  Saturations on room air in the 80s with EMS.  On 15 L nonrebreather upon arrival.  Has progressed to requiring heated high flow and prone positioning to tolerate saturations in the upper 80s.  No increased work of breathing. Patient is 5\' 4" .  Husband is Ambulance person. Recently on steroids anticipating an asthma exacerbation when school restarted. Generally has well-controlled asthma on Breo and seldom needs albuterol. 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 Only other known PMH is fibromyalgia. Never smoker; has had second hand smoke exposure from husband.  Past Medical History:  Diagnosis Date  . Asthma 06/17/2020  . Eczema   . Fibromyalgia    lyrica-pain in legs and arms-controlled  . Seasonal allergies     Past Surgical History:  Procedure Laterality Date  . DILATION AND CURETTAGE OF UTERUS  2012  . LAPAROSCOPIC ASSISTED VAGINAL HYSTERECTOMY  09/02/2012   Procedure: LAPAROSCOPIC ASSISTED VAGINAL HYSTERECTOMY;  Surgeon: Darlyn Chamber, MD;  Location: [facility];  Service: Gynecology;  Laterality: N/A;  with cystoscopy  . LIPOMA EXCISION     local  . MOUTH SURGERY  2013  . NASAL SINUS SURGERY    . SEPTOPLASTY    . TUBAL LIGATION    . WISDOM TOOTH EXTRACTION      Family History  Problem Relation Age of Onset  . Diabetes Mellitus II Mother     Social History:  reports that she has never smoked. She does not have any smokeless tobacco history on file. She reports current alcohol use. She reports that she does not use drugs.  Allergies:  Allergies  Allergen Reactions  . Effexor [Venlafaxine Hcl] Other (See Comments)    Pt becomes suicidal  . Percocet [Oxycodone-Acetaminophen] Nausea And Vomiting  . Vicodin [Hydrocodone-Acetaminophen] Nausea And Vomiting    Medications:  I have reviewed the patient's current medications. Prior to Admission: (Not in a hospital admission)  Scheduled: .  vitamin C  500 mg Oral Daily  . baricitinib  4 mg Oral Daily  . enoxaparin (LOVENOX) injection  40 mg Subcutaneous Q0600  . fluticasone furoate-vilanterol  1 puff Inhalation Daily  . insulin aspart  0-9 Units Subcutaneous Q4H  . insulin detemir  0.075 Units/kg Subcutaneous BID  . Ipratropium-Albuterol  2 puff Inhalation Q6H  . linagliptin  5 mg Oral Daily  . methylPREDNISolone (SOLU-MEDROL) injection  0.5 mg/kg Intravenous BID  . montelukast  10 mg Oral QHS  . ondansetron (ZOFRAN) IV  4 mg Intravenous Once  . phosphorus  500 mg Oral BID  . pregabalin  200 mg Oral Daily  . vitamin B-12  1,000 mcg Oral Daily  . zinc sulfate  220 mg Oral Daily   ALP:[id number] **OR** acetaminophen, albuterol, diphenhydrAMINE, docusate sodium, guaiFENesin-dextromethorphan, ondansetron **OR** ondansetron (ZOFRAN) IV, polyethylene glycol  Results for orders placed or performed during the hospital encounter of 06/01/2020 (from the past 48 hour(s))  Lactic acid, plasma     Status: None   Collection Time: 06/15/2020 10:24 PM  Result Value Ref Range   Lactic Acid, Venous 1.0 0.5 - 1.9 mmol/L    Comment: Performed at [facility], [address], [place],  [address]  CBC WITH DIFFERENTIAL     Status: Abnormal   Collection Time: 06/03/2020 10:24 PM  Result Value Ref Range   WBC 11.0 (H) 4.0 - 10.5 K/uL   RBC 5.43 (H) 3.87 - 5.11 MIL/uL   Hemoglobin 15.0 12.0 - 15.0 g/dL   HCT 48.9 (H) 36 - 46 %   MCV 90.1 80.0 - 100.0 fL   MCH 27.6 26.0 - 34.0 pg   MCHC 30.7 30.0 - 36.0 g/dL   RDW 14.1 11.5 - 15.5 %   Platelets 200 150 - 400 K/uL   nRBC 0.0 0.0 - 0.2 %   Neutrophils Relative % 88 %   Neutro Abs 9.7 (H) 1.7 - 7.7 K/uL   Lymphocytes Relative 7 %   Lymphs Abs 0.7 0.7 - 4.0 K/uL   Monocytes Relative 4 %   Monocytes Absolute 0.4 0 - 1  K/uL   Eosinophils Relative 0 %   Eosinophils Absolute 0.0 0 - 0 K/uL   Basophils Relative 0 %   Basophils Absolute 0.0 0 - 0 K/uL   Immature Granulocytes 1 %    Abs Immature Granulocytes 0.07 0.00 - 0.07 K/uL    Comment: Performed at [facility], [place] [address], [place], [place] [address]  Comprehensive metabolic panel     Status: Abnormal   Collection Time: 06/07/2020 10:24 PM  Result Value Ref Range   Sodium 140 135 - 145 mmol/L   Potassium 3.9 3.5 - 5.1 mmol/L   Chloride 106 98 - 111 mmol/L   CO2 20 (L) 22 - 32 mmol/L   Glucose, Bld 171 (H) 70 - 99 mg/dL    Comment: Glucose reference range applies only to samples taken after fasting for at least 8 hours.   BUN 13 6 - 20 mg/dL   Creatinine, Ser 1.11 (H) 0.44 - 1.00 mg/dL   Calcium 8.7 (L) 8.9 - 10.3 mg/dL   Total Protein 6.7 6.5 - 8.1 g/dL   Albumin 2.8 (L) 3.5 - 5.0 g/dL   AST 54 (H) 15 - 41 U/L   ALT 41 0 - 44 U/L   Alkaline Phosphatase 57 38 - 126 U/L   Total Bilirubin 0.6 0.3 - 1.2 mg/dL   GFR calc non Af Amer >60 >60 mL/min   GFR calc Af Amer >60 >60 mL/min   Anion gap 14 5 - 15    Comment: Performed at [facility] [address] [place], [place] [address]  D-dimer, quantitative     Status: Abnormal   Collection Time: 06/13/2020 10:24 PM  Result Value Ref Range   D-Dimer, Quant 0.85 (H) 0.00 - 0.50 ug/mL-FEU    Comment: (NOTE) At the manufacturer cut-off of 0.50 ug/mL FEU, this assay has been documented to exclude PE with a sensitivity and negative predictive value of 97 to 99%.  At this time, this assay has not been approved by the FDA to exclude DVT/VTE. Results should be correlated with clinical presentation. Performed at [facility], [place] [address], [place], [place] [address]   Procalcitonin     Status: None   Collection Time: 06/10/2020 10:24 PM  Result Value Ref Range   Procalcitonin 1.67 ng/mL    Comment:        Interpretation: PCT > 0.5 ng/mL and <= 2 ng/mL: Systemic infection (sepsis) is possible, but other conditions are known to elevate PCT as well. (NOTE)       Sepsis PCT Algorithm           Lower Respiratory Tract                                       Infection PCT Algorithm    ----------------------------     ----------------------------         PCT < 0.25 ng/mL                PCT < 0.10 ng/mL          Strongly encourage             Strongly discourage   discontinuation of antibiotics    initiation of antibiotics    ----------------------------     -----------------------------       PCT 0.25 - 0.50 ng/mL  PCT 0.10 - 0.25 ng/mL               OR       >80% decrease in PCT            Discourage initiation of                                            antibiotics      Encourage discontinuation           of antibiotics    ----------------------------     -----------------------------         PCT >= 0.50 ng/mL              PCT 0.26 - 0.50 ng/mL                AND       <80% decrease in PCT             Encourage initiation of                                             antibiotics       Encourage continuation           of antibiotics    ----------------------------     -----------------------------        PCT >= 0.50 ng/mL                  PCT > 0.50 ng/mL               AND         increase in PCT                  Strongly encourage                                      initiation of antibiotics    Strongly encourage escalation           of antibiotics                                     -----------------------------                                           PCT <= 0.25 ng/mL                                                 OR                                        > 80% decrease in PCT  Discontinue / Do not initiate                                             antibiotics  Performed at [facility], [place] [address] [place], [place] [address]   Lactate dehydrogenase     Status: Abnormal   Collection Time: 06/20/2020 10:24 PM  Result Value Ref Range   LDH 450 (H) 98 - 192 U/L    Comment: Performed at [facility], [place] [address], [place], [place] [address]   Ferritin     Status: Abnormal   Collection Time: 06/04/2020 10:24 PM  Result Value Ref Range   Ferritin 954 (H) 11 - 307 ng/mL    Comment: Performed at [facility], [place] [address], [place], [place] [address]  Triglycerides     Status: None   Collection Time: 06/06/2020 10:24 PM  Result Value Ref Range   Triglycerides 129 <150 mg/dL    Comment: Performed at [facility] [address] [place], [place] [address]  Fibrinogen     Status: Abnormal   Collection Time: 06/08/2020 10:24 PM  Result Value Ref Range   Fibrinogen 651 (H) 210 - 475 mg/dL    Comment: Performed at [facility] [address], [place], [place] [address]  C-reactive protein     Status: Abnormal   Collection Time: 06/14/2020 10:24 PM  Result Value Ref Range   CRP 19.4 (H) <1.0 mg/dL    Comment: Performed at [facility], [place] [address] [place], [place] [address]  I-Stat beta hCG blood, ED     Status: None   Collection Time: 05/23/2020 10:32 PM  Result Value Ref Range   I-stat hCG, quantitative <5.0 <5 mIU/mL   Comment 3            Comment:   GEST. AGE      CONC.  (mIU/mL)   <=1 WEEK        5 - 50     2 WEEKS       50 - 500     3 WEEKS       100 - 10,000     4 WEEKS     1,000 - 30,000        FEMALE AND NON-PREGNANT FEMALE:     LESS THAN 5 mIU/mL   SARS Coronavirus 2 by RT PCR (hospital order, performed in [facility] hospital lab) Nasopharyngeal Nasopharyngeal Swab     Status: Abnormal   Collection Time: 06/17/20  2:19 AM   Specimen: Nasopharyngeal Swab  Result Value Ref Range   SARS Coronavirus 2 POSITIVE (A) NEGATIVE    Comment: RESULT CALLED TO, READ BACK BY AND VERIFIED WITH: L HARRISON RN 06/17/20 0330 JDW (NOTE) SARS-CoV-2 target nucleic acids are DETECTED  SARS-CoV-2 RNA is generally detectable in upper respiratory specimens  during the acute phase of infection.  Positive results are indicative  of the presence of the identified virus, but do not rule out bacterial infection or  co-infection with other pathogens not detected by the test.  Clinical correlation with patient history and  other diagnostic information is necessary to determine patient infection status.  The expected result is negative.  Fact Sheet for Patients:   StrictlyIdeas.no   Fact Sheet for Healthcare Providers:   BankingDealers.co.za    This  test is not yet approved or cleared by the [place] and  has been authorized for detection and/or diagnosis of SARS-CoV-2 by FDA under an Emergency Use Authorization (EUA).  This EUA will remain in effect (meaning this test c an be used) for the duration of  the COVID-19 declaration under Section 564(b)(1) of the Act, 21 U.S.C. section 360-bbb-3(b)(1), unless the authorization is terminated or revoked sooner.  Performed at [facility], [place] [address], [place], [place] [address]   Strep pneumoniae urinary antigen     Status: None   Collection Time: 06/17/20  2:51 AM  Result Value Ref Range   Strep Pneumo Urinary Antigen NEGATIVE NEGATIVE    Comment:        Infection due to S. pneumoniae cannot be absolutely ruled out since the antigen present may be below the detection limit of the test. Performed at [facility], [place] [address] [place], [place] [address]   ABO/Rh     Status: None   Collection Time: 06/17/20  4:47 AM  Result Value Ref Range   ABO/RH(D)      O POS Performed at [facility] [address] [place], [place] [address]   CBG monitoring, ED     Status: Abnormal   Collection Time: 06/17/20  8:30 AM  Result Value Ref Range   Glucose-Capillary 214 (H) 70 - 99 mg/dL    Comment: Glucose reference range applies only to samples taken after fasting for at least 8 hours.  CBC with Differential/Platelet     Status: Abnormal   Collection Time: 06/17/20  9:05 AM  Result Value Ref Range   WBC 8.8 4.0 - 10.5 K/uL   RBC 5.01 3.87 - 5.11 MIL/uL   Hemoglobin 14.1  12.0 - 15.0 g/dL   HCT 44.7 36 - 46 %   MCV 89.2 80.0 - 100.0 fL   MCH 28.1 26.0 - 34.0 pg   MCHC 31.5 30.0 - 36.0 g/dL   RDW 14.1 11.5 - 15.5 %   Platelets 196 150 - 400 K/uL   nRBC 0.0 0.0 - 0.2 %   Neutrophils Relative % 90 %   Neutro Abs 8.0 (H) 1.7 - 7.7 K/uL   Lymphocytes Relative 7 %   Lymphs Abs 0.6 (L) 0.7 - 4.0 K/uL   Monocytes Relative 2 %   Monocytes Absolute 0.1 0 - 1 K/uL   Eosinophils Relative 0 %   Eosinophils Absolute 0.0 0 - 0 K/uL   Basophils Relative 0 %   Basophils Absolute 0.0 0 - 0 K/uL   WBC Morphology See Note     Comment: Increased Bands. >20% Bands   Immature Granulocytes 1 %   Abs Immature Granulocytes 0.06 0.00 - 0.07 K/uL    Comment: Performed at [facility], [place] [address] [place], [place] [address]  Comprehensive metabolic panel     Status: Abnormal   Collection Time: 06/17/20  9:05 AM  Result Value Ref Range   Sodium 139 135 - 145 mmol/L   Potassium 4.2 3.5 - 5.1 mmol/L   Chloride 106 98 - 111 mmol/L   CO2 22 22 - 32 mmol/L   Glucose, Bld 259 (H) 70 - 99 mg/dL    Comment: Glucose reference range applies only to samples taken after fasting for at least 8 hours.   BUN 13 6 - 20 mg/dL   Creatinine, Ser 1.06 (H) 0.44 - 1.00 mg/dL   Calcium 8.0 (L) 8.9 -  10.3 mg/dL   Total Protein 5.9 (L) 6.5 - 8.1 g/dL   Albumin 2.2 (L) 3.5 - 5.0 g/dL   AST 41 15 - 41 U/L   ALT 33 0 - 44 U/L   Alkaline Phosphatase 46 38 - 126 U/L   Total Bilirubin 0.5 0.3 - 1.2 mg/dL   GFR calc non Af Amer >60 >60 mL/min   GFR calc Af Amer >60 >60 mL/min   Anion gap 11 5 - 15    Comment: Performed at [facility] [address] [place], [place] [address]  Magnesium     Status: Abnormal   Collection Time: 06/17/20  9:05 AM  Result Value Ref Range   Magnesium 1.6 (L) 1.7 - 2.4 mg/dL    Comment: Performed at [facility] [address], [place], [place] [address]  Phosphorus     Status: Abnormal   Collection Time: 06/17/20  9:05 AM  Result Value Ref  Range   Phosphorus 2.0 (L) 2.5 - 4.6 mg/dL    Comment: Performed at [facility] [address] [place], [place] [address]  D-dimer, quantitative (not at [facility])     Status: Abnormal   Collection Time: 06/17/20  9:05 AM  Result Value Ref Range   D-Dimer, Quant 0.83 (H) 0.00 - 0.50 ug/mL-FEU    Comment: (NOTE) At the manufacturer cut-off of 0.50 ug/mL FEU, this assay has been documented to exclude PE with a sensitivity and negative predictive value of 97 to 99%.  At this time, this assay has not been approved by the FDA to exclude DVT/VTE. Results should be correlated with clinical presentation. Performed at [facility], [place] [address], [place], [place] [address]   Fibrinogen     Status: Abnormal   Collection Time: 06/17/20  9:05 AM  Result Value Ref Range   Fibrinogen 707 (H) 210 - 475 mg/dL    Comment: Performed at [facility] [address] [place], [place] [address]  Ferritin     Status: Abnormal   Collection Time: 06/17/20  9:05 AM  Result Value Ref Range   Ferritin 1,015 (H) 11 - 307 ng/mL    Comment: Performed at [facility], [place] [address] [place], [place] [address]  C-reactive protein     Status: Abnormal   Collection Time: 06/17/20  9:05 AM  Result Value Ref Range   CRP 22.0 (H) <1.0 mg/dL    Comment: Performed at [facility] [address] [place], [place] [address]  Sedimentation rate     Status: Abnormal   Collection Time: 06/17/20  9:05 AM  Result Value Ref Range   Sed Rate 35 (H) 0 - 22 mm/hr    Comment: Performed at [facility] [address] [place], [place] [address]  Lactate dehydrogenase     Status: Abnormal   Collection Time: 06/17/20  9:05 AM  Result Value Ref Range   LDH 482 (H) 98 - 192 U/L    Comment: Performed at [facility], [place] [address], [place], [place] [address]  CBG monitoring, ED     Status: Abnormal   Collection Time: 06/17/20 12:58 PM  Result Value Ref Range   Glucose-Capillary 180  (H) 70 - 99 mg/dL    Comment: Glucose reference range applies only to samples taken after fasting for at least 8 hours.    DG Chest Port 1 View  Result Date: 06/19/2020 CLINICAL DATA:  Shortness of breath, COVID EXAM: PORTABLE CHEST 1 VIEW COMPARISON:  None. FINDINGS: Low lung volumes. Fairly widespread bilateral ground-glass opacities and patchy consolidations. Normal heart size. No pneumothorax. IMPRESSION: Bilateral ground-glass opacities and patchy consolidations, consistent with bilateral pneumonia Electronically Signed   By: Donavan Foil M.D.   On: 06/22/2020 21:42    Review of Systems  Constitutional: Positive for chills, fatigue and fever.  Respiratory: Positive for shortness of breath.   Musculoskeletal: Positive for myalgias.  Neurological: Positive for headaches.   Blood pressure 127/86, pulse 97, temperature 98.1 F (36.7 C), resp. rate (!) 25, weight 80 kg, SpO2 (!) 89 %. Physical Exam  Ill-appearing woman laying in bed prone position [place]/AT, eyes anicteric Regular rate and rhythm Coarse rales bilaterally, prolonged exhalation.  No accessory muscle use, no tachypnea. Abdominal exam limited due to prone positioning No peripheral edema, no clubbing or cyanosis Awake and alert, answering questions appropriately. No rashes or wounds.  Assessment/Plan: Acute hypoxic respiratory failure due to COVID-19 viral pneumonia -Supplemental oxygen as required to maintain SPO2 greater than 85%.  Doing well with prone positioning currently.  Decision to intubate to be based on combination of sepsis, work of breathing, and development of confusion. -If she were to progress to mechanical ventilation with refractory hypoxic respiratory failure refractory to prone positioning, low tidal volume ventilation, neuromuscular blockade, she would be a candidate for advanced therapy with ECMO. -Remdesivir, steroids, baricitinib, anticoagulation per protocol -Empiric CAP antibiotics  Obesity, BMI  30.3 Asthma -Continue home Breo and montelukast -Bronchodilators as needed -Steroids  Hyperglycemia -A1c pending -Linagliptin daily -Basal bolus insulin -Goal BG 140-180 while admitted to the ICU   Julian Hy Date: 06/17/2020 Time: 5:29 PM

## 2020-06-18 ENCOUNTER — Inpatient Hospital Stay (HOSPITAL_COMMUNITY): Payer: BC Managed Care – PPO

## 2020-06-18 LAB — CBC WITH DIFFERENTIAL/PLATELET
Abs Immature Granulocytes: 0.07 10*3/uL (ref 0.00–0.07)
Basophils Absolute: 0 10*3/uL (ref 0.0–0.1)
Basophils Relative: 0 %
Eosinophils Absolute: 0 10*3/uL (ref 0.0–0.5)
Eosinophils Relative: 0 %
HCT: 40.4 % (ref 36.0–46.0)
Hemoglobin: 12.8 g/dL (ref 12.0–15.0)
Immature Granulocytes: 1 %
Lymphocytes Relative: 9 %
Lymphs Abs: 1.2 10*3/uL (ref 0.7–4.0)
MCH: 28 pg (ref 26.0–34.0)
MCHC: 31.7 g/dL (ref 30.0–36.0)
MCV: 88.4 fL (ref 80.0–100.0)
Monocytes Absolute: 0.5 10*3/uL (ref 0.1–1.0)
Monocytes Relative: 3 %
Neutro Abs: 11.8 10*3/uL — ABNORMAL HIGH (ref 1.7–7.7)
Neutrophils Relative %: 87 %
Platelets: 208 10*3/uL (ref 150–400)
RBC: 4.57 MIL/uL (ref 3.87–5.11)
RDW: 14 % (ref 11.5–15.5)
WBC Morphology: INCREASED
WBC: 13.6 10*3/uL — ABNORMAL HIGH (ref 4.0–10.5)
nRBC: 0 % (ref 0.0–0.2)

## 2020-06-18 LAB — COMPREHENSIVE METABOLIC PANEL
ALT: 27 U/L (ref 0–44)
AST: 36 U/L (ref 15–41)
Albumin: 2.1 g/dL — ABNORMAL LOW (ref 3.5–5.0)
Alkaline Phosphatase: 45 U/L (ref 38–126)
Anion gap: 10 (ref 5–15)
BUN: 12 mg/dL (ref 6–20)
CO2: 23 mmol/L (ref 22–32)
Calcium: 7.6 mg/dL — ABNORMAL LOW (ref 8.9–10.3)
Chloride: 111 mmol/L (ref 98–111)
Creatinine, Ser: 0.87 mg/dL (ref 0.44–1.00)
GFR calc Af Amer: >60 mL/min (ref >60)
GFR calc non Af Amer: >60 mL/min (ref >60)
Glucose, Bld: 155 mg/dL — ABNORMAL HIGH (ref 70–99)
Potassium: 4 mmol/L (ref 3.5–5.1)
Sodium: 144 mmol/L (ref 135–145)
Total Bilirubin: 0.4 mg/dL (ref 0.3–1.2)
Total Protein: 5.6 g/dL — ABNORMAL LOW (ref 6.5–8.1)

## 2020-06-18 LAB — GLUCOSE, CAPILLARY
Glucose-Capillary: 128 mg/dL — ABNORMAL HIGH (ref 70–99)
Glucose-Capillary: 140 mg/dL — ABNORMAL HIGH (ref 70–99)
Glucose-Capillary: 145 mg/dL — ABNORMAL HIGH (ref 70–99)
Glucose-Capillary: 166 mg/dL — ABNORMAL HIGH (ref 70–99)
Glucose-Capillary: 172 mg/dL — ABNORMAL HIGH (ref 70–99)
Glucose-Capillary: 173 mg/dL — ABNORMAL HIGH (ref 70–99)
Glucose-Capillary: 180 mg/dL — ABNORMAL HIGH (ref 70–99)

## 2020-06-18 LAB — HEMOGLOBIN A1C
Hgb A1c MFr Bld: 5.6 % (ref 4.8–5.6)
Mean Plasma Glucose: 114.02 mg/dL

## 2020-06-18 LAB — HIV ANTIBODY (ROUTINE TESTING W REFLEX): HIV Screen 4th Generation wRfx: NONREACTIVE

## 2020-06-18 LAB — FIBRINOGEN: Fibrinogen: 667 mg/dL — ABNORMAL HIGH (ref 210–475)

## 2020-06-18 LAB — LACTATE DEHYDROGENASE: LDH: 599 U/L — ABNORMAL HIGH (ref 98–192)

## 2020-06-18 LAB — C-REACTIVE PROTEIN: CRP: 15.4 mg/dL — ABNORMAL HIGH (ref <1.0)

## 2020-06-18 LAB — MAGNESIUM: Magnesium: 1.9 mg/dL (ref 1.7–2.4)

## 2020-06-18 LAB — SEDIMENTATION RATE: Sed Rate: 48 mm/hr — ABNORMAL HIGH (ref 0–22)

## 2020-06-18 LAB — PHOSPHORUS: Phosphorus: 2.3 mg/dL — ABNORMAL LOW (ref 2.5–4.6)

## 2020-06-18 LAB — D-DIMER, QUANTITATIVE: D-Dimer, Quant: 0.96 ug/mL-FEU — ABNORMAL HIGH (ref 0.00–0.50)

## 2020-06-18 LAB — FERRITIN: Ferritin: 1791 ng/mL — ABNORMAL HIGH (ref 11–307)

## 2020-06-18 MED ORDER — SODIUM CHLORIDE 0.9 % IV SOLN: INTRAVENOUS | Status: DC

## 2020-06-18 NOTE — Progress Notes (Signed)
RT at bedside to place BIPAP for Sat in low to mid 80's.  Patient self proned on stomach and sats improved to 90%.  No distress noted at this time. RN will evaluate patient oxygenation when asleep and reevaluate with CCM BIPAP.

## 2020-06-18 NOTE — Consult Note (Addendum)
NAME:  Lisa [name], MRN:  [id number], DOB:  1976/09/10, LOS: 1 ADMISSION DATE:  06/12/2020, CONSULTATION DATE: 06/17/2020 REFERRING MD: Dr. Aleene Davidson, CHIEF COMPLAINT: COVID-19 pneumonia  Brief History   44 year old female, no significant past medical history admitted for bilateral pneumonia, COVID-19 positive.  Pulmonary critical care consulted for evaluation as she is on 100% heated high flow 40 L.  History of present illness   This is a 44 year old female with no significant past medical history except for asthma, BMI 30, recent sick contact, daughter testing positive for COVID-19.  Patient was diagnosed on Monday symptoms started last weekend after known sick contact with patient's daughter who tested positive she was found to be hypoxic in the emergency department.  Initial labs revealed elevated white count of 11,000, initial CRP of 22, D-dimer of 0.85, glucose 171, serum creatinine of 1.  Patient has had progressive hypoxia in the ER today.  Had escalating delivery mechanisms for oxygen.  At this point on heated high flow nasal cannula at 100% and 40 L.  She feels comfortable breathing on this.  She does not feel labored.  She is able to communicate in full sentences.  However O2 sats dropped into the high 70s.  If she rests comfortably her O2 sats maintained at about 88%.  We discussed the utility of self proning.  Patient was self proned in the emergency department on heated high flow and her O2 sats came up to 93%.  Past Medical History   Past Medical History:  Diagnosis Date  . Asthma 06/17/2020  . Eczema   . Fibromyalgia    lyrica-pain in legs and arms-controlled  . Seasonal allergies      Significant Hospital Events   ICU admission  Consults:  Pulmonary critical care  Procedures:    Significant Diagnostic Tests:  Chest x-ray: Bilateral patchy airspace disease concerning for pneumonia.  Micro Data:  COVID-19 PCR: Positive  Antimicrobials:  None  Interim  history/subjective:  On BiPAP overnight for hypoxia. Back to HFNC this AM and tolerating well.   Objective   Blood pressure 126/87, pulse 98, temperature 99.9 F (37.7 C), temperature source Axillary, resp. rate 20, weight 90.2 kg, SpO2 90 %.     Body mass index is 34.4 kg/m.   Vent Mode: BIPAP FiO2 (%):  [100 %] 100 % PEEP:  [8 cmH20] 8 cmH20 Pressure Support:  [8 cmH20] 8 cmH20   Intake/Output Summary (Last 24 hours) at 06/18/2020 0937 Last data filed at 06/18/2020 0700 Gross per 24 hour  Intake 1187.35 ml  Output 350 ml  Net 837.35 ml   Filed Weights   06/17/20 0900 06/18/20 0500  Weight: 80 kg 90.2 kg    Examination: General: adult female in NAD on BiPAP HENT: [place]/AT, PERRl, no JVD Lungs: Diminished throuhghout. sats 86% on 40L HFNC.  Cardiovascular: RRR, no MRG. Abdomen: soft, non-tender, non-distended Extremities: Edema, no rash Neuro: Moves all 4 extremities on command no deficit GU: Deferred  Resolved Hospital Problem list     Assessment & Plan:   Acute hypoxemic respiratory failure  COVID-19 pneumonia, bilateral  Severe hypoxemia Currently on 40L HHFNC, was on BiPAP overnight.  Plan: Remdesivir, steroids, baricitinib  Self-proning as tolerated Continue to wean FiO2 as tolerated. Decision for intubation should be made if she cannot maintain O2 sats > 85% or develops respiratory distress.  At risk for intubation Potentially a candidate for ECMO should she be intubated, see separate ECMO consult note. Continue to trend inflammatory markers  Asthma: Continue on IV steroids at this time  Best practice:  Diet: Clear liquids Pain/Anxiety/Delirium protocol (if indicated): Not applicable VAP protocol (if indicated): Not applicable DVT prophylaxis: Lovenox GI prophylaxis: H2 blocker Glucose control: Covid glycemic protocol Mobility: Bedrest, self proning Code Status: Full code Family Communication: Family to be updated, Disposition: ICU  Labs     CBC: Recent Labs  Lab 05/28/2020 2224 06/17/20 0905 06/18/20 0638  WBC 11.0* 8.8 13.6*  NEUTROABS 9.7* 8.0* 11.8*  HGB 15.0 14.1 12.8  HCT 48.9* 44.7 40.4  MCV 90.1 89.2 88.4  PLT 200 196 213    Basic Metabolic Panel: Recent Labs  Lab 06/01/2020 2224 06/17/20 0905 06/18/20 0638  NA 140 139 144  K 3.9 4.2 4.0  CL 106 106 111  CO2 20* 22 23  GLUCOSE 171* 259* 155*  BUN 13 13 12   CREATININE 1.11* 1.06* 0.87  CALCIUM 8.7* 8.0* 7.6*  MG  --  1.6* 1.9  PHOS  --  2.0* 2.3*   GFR: CrCl cannot be calculated (Unknown ideal weight.). Recent Labs  Lab 06/18/2020 2224 06/17/20 0905 06/18/20 0638  PROCALCITON 1.67  --   --   WBC 11.0* 8.8 13.6*  LATICACIDVEN 1.0  --   --     Liver Function Tests: Recent Labs  Lab 05/25/2020 2224 06/17/20 0905 06/18/20 0638  AST 54* 41 36  ALT 41 33 27  ALKPHOS 57 46 45  BILITOT 0.6 0.5 0.4  PROT 6.7 5.9* 5.6*  ALBUMIN 2.8* 2.2* 2.1*   No results for input(s): LIPASE, AMYLASE in the last 168 hours. No results for input(s): AMMONIA in the last 168 hours.  ABG No results found for: PHART, PCO2ART, PO2ART, HCO3, TCO2, ACIDBASEDEF, O2SAT   Coagulation Profile: No results for input(s): INR, PROTIME in the last 168 hours.  Cardiac Enzymes: No results for input(s): CKTOTAL, CKMB, CKMBINDEX, TROPONINI in the last 168 hours.  HbA1C: Hgb A1c MFr Bld  Date/Time Value Ref Range Status  06/18/2020 06:38 AM 5.6 4.8 - 5.6 % Final    Comment:    (NOTE) Pre diabetes:          5.7%-6.4%  Diabetes:              >6.4%  Glycemic control for   <7.0% adults with diabetes     CBG: Recent Labs  Lab 06/17/20 2004 06/17/20 2142 06/17/20 2351 06/18/20 0333 06/18/20 0837  GLUCAP 163* 145* 194* 172* 140*       Past Medical History  She,  has a past medical history of Asthma (06/17/2020), Eczema, Fibromyalgia, and Seasonal allergies.   Surgical History    Past Surgical History:  Procedure Laterality Date  . DILATION AND CURETTAGE OF  UTERUS  2012  . LAPAROSCOPIC ASSISTED VAGINAL HYSTERECTOMY  09/02/2012   Procedure: LAPAROSCOPIC ASSISTED VAGINAL HYSTERECTOMY;  Surgeon: Darlyn Chamber, MD;  Location: [facility];  Service: Gynecology;  Laterality: N/A;  with cystoscopy  . LIPOMA EXCISION     local  . MOUTH SURGERY  2013  . NASAL SINUS SURGERY    . SEPTOPLASTY    . TUBAL LIGATION    . WISDOM TOOTH EXTRACTION       Social History   reports that she has never smoked. She does not have any smokeless tobacco history on file. She reports current alcohol use. She reports that she does not use drugs.   Family History   Her family history includes Diabetes Mellitus II in her mother.  Allergies Allergies  Allergen Reactions  . Effexor [Venlafaxine Hcl] Other (See Comments)    Pt becomes suicidal  . Percocet [Oxycodone-Acetaminophen] Nausea And Vomiting  . Vicodin [Hydrocodone-Acetaminophen] Nausea And Vomiting     Home Medications  Prior to Admission medications   Medication Sig Start Date End Date Taking? Authorizing Provider  albuterol (VENTOLIN HFA) 108 (90 Base) MCG/ACT inhaler Inhale into the lungs every 6 (six) hours as needed for wheezing or shortness of breath.   Yes [provider]  celecoxib (CELEBREX) 200 MG capsule Take 200 mg by mouth 2 (two) times daily.   Yes [provider]  diphenhydrAMINE (BENADRYL) 25 mg capsule Take 25 mg by mouth every 6 (six) hours as needed. For allergies   Yes [provider]  ergocalciferol (VITAMIN D2) 1.25 MG (50000 UT) capsule Take 50,000 Units by mouth every Tuesday.    Yes [provider]  fluticasone furoate-vilanterol (BREO ELLIPTA) 200-25 MCG/INH AEPB Inhale 1 puff into the lungs daily.   Yes [provider]  montelukast (SINGULAIR) 10 MG tablet Take 10 mg by mouth daily.    Yes [provider]  pregabalin (LYRICA) 200 MG capsule Take 200 mg by mouth at bedtime.    Yes [provider]  vitamin B-12  (CYANOCOBALAMIN) 1000 MCG tablet Take 1,000 mcg by mouth every other day.    Yes [provider]      Georgann Housekeeper, AGACNP-BC [place]  See Amion for personal pager PCCM on call pager [phone]  06/18/2020 10:46 AM   PCCM:  44 yo COVID 19 PNA on HHFNC,  BP 99/70 (BP Location: Right Arm)   Pulse 81   Temp 99.9 F (37.7 C) (Axillary)   Resp (!) 21   Wt 90.2 kg   SpO2 (!) 84%   BMI 34.40 kg/m   Gen: young FM, on HHFNC  Heart: RRR s1 s2 Lungs: basilar crackles, no wheeze  Abd: self proned   Labs: reviewed  CXR: worsenign BL infiltrates, The patient's images have been independently reviewed by me.    A: Covid 19 PNA AHRF on HHFNC   P: Continue covid treatments  Self proning  Was seen as a pre-screen for ecmo if she decompensates  This patient is critically ill with multiple organ system failure; which, requires frequent high complexity decision making, assessment, support, evaluation, and titration of therapies. This was completed through the application of advanced monitoring technologies and extensive interpretation of multiple databases. During this encounter critical care time was devoted to patient care services described in this note for 32 minutes.  [facility] 06/18/2020 4:16 PM

## 2020-06-18 NOTE — Progress Notes (Signed)
eLink Physician-Brief Progress Note Patient Name: Lisa [name] DOB: Dec 21, 1975 MRN: [id number]   Date of Service  06/18/2020  HPI/Events of Note  Sat has improved to 87% with proning.   eICU Interventions  Will hold off on BiPAP and ABG at 1 AM.     Intervention Category Major Interventions: Hypoxemia - evaluation and management  Yuji Walth Cornelia Copa 06/18/2020, 12:53 AM

## 2020-06-18 NOTE — Progress Notes (Signed)
Pt refuses to let RT get morning ABG.RT stressed the importance of getting ABG but pt is tiling.

## 2020-06-19 ENCOUNTER — Inpatient Hospital Stay: Payer: Self-pay

## 2020-06-19 LAB — LACTATE DEHYDROGENASE: LDH: 675 U/L — ABNORMAL HIGH (ref 98–192)

## 2020-06-19 LAB — GLUCOSE, CAPILLARY
Glucose-Capillary: 137 mg/dL — ABNORMAL HIGH (ref 70–99)
Glucose-Capillary: 148 mg/dL — ABNORMAL HIGH (ref 70–99)
Glucose-Capillary: 150 mg/dL — ABNORMAL HIGH (ref 70–99)
Glucose-Capillary: 157 mg/dL — ABNORMAL HIGH (ref 70–99)
Glucose-Capillary: 160 mg/dL — ABNORMAL HIGH (ref 70–99)
Glucose-Capillary: 168 mg/dL — ABNORMAL HIGH (ref 70–99)
Glucose-Capillary: 174 mg/dL — ABNORMAL HIGH (ref 70–99)
Glucose-Capillary: 191 mg/dL — ABNORMAL HIGH (ref 70–99)

## 2020-06-19 LAB — CBC WITH DIFFERENTIAL/PLATELET
Abs Immature Granulocytes: 0.08 10*3/uL — ABNORMAL HIGH (ref 0.00–0.07)
Basophils Absolute: 0 10*3/uL (ref 0.0–0.1)
Basophils Relative: 0 %
Eosinophils Absolute: 0 10*3/uL (ref 0.0–0.5)
Eosinophils Relative: 0 %
HCT: 40.9 % (ref 36.0–46.0)
Hemoglobin: 12.4 g/dL (ref 12.0–15.0)
Immature Granulocytes: 1 %
Lymphocytes Relative: 8 %
Lymphs Abs: 1.3 10*3/uL (ref 0.7–4.0)
MCH: 27.1 pg (ref 26.0–34.0)
MCHC: 30.3 g/dL (ref 30.0–36.0)
MCV: 89.3 fL (ref 80.0–100.0)
Monocytes Absolute: 0.6 10*3/uL (ref 0.1–1.0)
Monocytes Relative: 4 %
Neutro Abs: 13.2 10*3/uL — ABNORMAL HIGH (ref 1.7–7.7)
Neutrophils Relative %: 87 %
Platelets: 216 10*3/uL (ref 150–400)
RBC: 4.58 MIL/uL (ref 3.87–5.11)
RDW: 13.9 % (ref 11.5–15.5)
WBC: 15.2 10*3/uL — ABNORMAL HIGH (ref 4.0–10.5)
nRBC: 0 % (ref 0.0–0.2)

## 2020-06-19 LAB — COMPREHENSIVE METABOLIC PANEL
ALT: 25 U/L (ref 0–44)
AST: 29 U/L (ref 15–41)
Albumin: 2.2 g/dL — ABNORMAL LOW (ref 3.5–5.0)
Alkaline Phosphatase: 58 U/L (ref 38–126)
Anion gap: 8 (ref 5–15)
BUN: 19 mg/dL (ref 6–20)
CO2: 26 mmol/L (ref 22–32)
Calcium: 7.8 mg/dL — ABNORMAL LOW (ref 8.9–10.3)
Chloride: 110 mmol/L (ref 98–111)
Creatinine, Ser: 0.84 mg/dL (ref 0.44–1.00)
GFR calc Af Amer: >60 mL/min (ref >60)
GFR calc non Af Amer: >60 mL/min (ref >60)
Glucose, Bld: 177 mg/dL — ABNORMAL HIGH (ref 70–99)
Potassium: 4.4 mmol/L (ref 3.5–5.1)
Sodium: 144 mmol/L (ref 135–145)
Total Bilirubin: 0.4 mg/dL (ref 0.3–1.2)
Total Protein: 5.9 g/dL — ABNORMAL LOW (ref 6.5–8.1)

## 2020-06-19 LAB — FIBRINOGEN: Fibrinogen: 631 mg/dL — ABNORMAL HIGH (ref 210–475)

## 2020-06-19 LAB — D-DIMER, QUANTITATIVE: D-Dimer, Quant: 1.51 ug/mL-FEU — ABNORMAL HIGH (ref 0.00–0.50)

## 2020-06-19 LAB — FERRITIN: Ferritin: 929 ng/mL — ABNORMAL HIGH (ref 11–307)

## 2020-06-19 LAB — PHOSPHORUS: Phosphorus: 2.5 mg/dL (ref 2.5–4.6)

## 2020-06-19 LAB — SEDIMENTATION RATE: Sed Rate: 47 mm/hr — ABNORMAL HIGH (ref 0–22)

## 2020-06-19 LAB — MAGNESIUM: Magnesium: 2.3 mg/dL (ref 1.7–2.4)

## 2020-06-19 LAB — C-REACTIVE PROTEIN: CRP: 8.4 mg/dL — ABNORMAL HIGH (ref <1.0)

## 2020-06-19 MED ORDER — DEXMEDETOMIDINE HCL IN NACL 400 MCG/100ML IV SOLN
0.4000 ug/kg/h | INTRAVENOUS | Status: DC
  Administered 2020-06-19: 0.8 ug/kg/h via INTRAVENOUS
  Administered 2020-06-19 (×2): 1.2 ug/kg/h via INTRAVENOUS
  Administered 2020-06-19: 0.4 ug/kg/h via INTRAVENOUS
  Administered 2020-06-20: 1.3 ug/kg/h via INTRAVENOUS
  Administered 2020-06-20: 1.4 ug/kg/h via INTRAVENOUS
  Filled 2020-06-19 (×7): qty 100

## 2020-06-19 MED ORDER — SODIUM CHLORIDE 0.9% FLUSH: 10.0000 mL | INTRAVENOUS | Status: DC | PRN

## 2020-06-19 MED ORDER — SODIUM CHLORIDE 0.9% FLUSH
10.0000 mL | Freq: Two times a day (BID) | INTRAVENOUS | Status: DC
  Administered 2020-06-19 – 2020-06-25 (×11): 10 mL

## 2020-06-19 MED ORDER — CLONAZEPAM 0.5 MG PO TABS: 0.5000 mg | ORAL_TABLET | Freq: Two times a day (BID) | ORAL | Status: DC | PRN

## 2020-06-19 NOTE — Progress Notes (Signed)
Peripherally Inserted Central Catheter Placement  The IV Nurse has discussed with the patient and/or persons authorized to consent for the patient, the purpose of this procedure and the potential benefits and risks involved with this procedure.  The benefits include less needle sticks, lab draws from the catheter, and the patient may be discharged home with the catheter. Risks include, but not limited to, infection, bleeding, blood clot (thrombus formation), and puncture of an artery; nerve damage and irregular heartbeat and possibility to perform a PICC exchange if needed/ordered by physician.  Alternatives to this procedure were also discussed.  Bard Power PICC patient education guide, fact sheet on infection prevention and patient information card has been provided to patient /or left at bedside. Telephone consent given by husband, Mayumi Summerson.   PICC Placement Documentation  PICC Triple Lumen 06/19/20 PICC Right Brachial 41 cm 0 cm (Active)  Indication for Insertion or Continuance of Line Prolonged intravenous therapies 06/19/20 1600  Exposed Catheter (cm) 0 cm 06/19/20 1600  Site Assessment Clean;Dry;Intact 06/19/20 1600  Lumen #1 Status Flushed;Blood return noted;Saline locked 06/19/20 1600  Lumen #2 Status Flushed;Blood return noted;Saline locked 06/19/20 1600  Lumen #3 Status Flushed;Blood return noted;Saline locked 06/19/20 1600  Dressing Type Transparent 06/19/20 1600  Dressing Status Clean;Dry;Intact;Antimicrobial disc in place 06/19/20 1600  Safety Lock Not Applicable 36/14/43 1540  Line Care Connections checked and tightened 06/19/20 1600  Dressing Intervention New dressing 06/19/20 1600  Dressing Change Due 06/26/20 06/19/20 1600       Lorenza Cambridge 06/19/2020, 4:22 PM

## 2020-06-19 NOTE — Progress Notes (Signed)
eLink Physician-Brief Progress Note Patient Name: Lisa [name] DOB: 10/09/1976 MRN: [id number]   Date of Service  06/19/2020  HPI/Events of Note  Acute hypoxemic respiratory failure  Secondary to Covid, and anxiety.  eICU Interventions  Precedex infusion ordered for modest sedation and anxiolysis.        Kerry Kass Kealani Leckey 06/19/2020, 5:25 AM

## 2020-06-19 NOTE — Progress Notes (Signed)
eLink Physician-Brief Progress Note Patient Name: BRAYLI KLINGBEIL DOB: 1976/04/13 MRN: [id number]   Date of Service  06/19/2020  HPI/Events of Note  Covid patient with hypoxemic respiratory failure, anxiety is better on Precedex. Saturation is 85 % currently.  eICU Interventions  Will monitor respiratory status closely for now.        Kerry Kass Haydan Mansouri 06/19/2020, 9:10 PM

## 2020-06-19 NOTE — Progress Notes (Signed)
NAME:  Lisa [name], MRN:  [id number], DOB:  1975/12/05, LOS: 2 ADMISSION DATE:  06/08/2020, CONSULTATION DATE: 06/17/2020 REFERRING MD: Dr. Aleene Davidson, CHIEF COMPLAINT: COVID-19 pneumonia  Brief History   44 year old female, no significant past medical history admitted for bilateral pneumonia, COVID-19 positive.  Pulmonary critical care consulted for evaluation as she is on 100% heated high flow 40 L.  History of present illness   This is a 44 year old female with no significant past medical history except for asthma, BMI 30, recent sick contact, daughter testing positive for COVID-19.  Patient was diagnosed on Monday symptoms started last weekend after known sick contact with patient's daughter who tested positive she was found to be hypoxic in the emergency department.  Initial labs revealed elevated white count of 11,000, initial CRP of 22, D-dimer of 0.85, glucose 171, serum creatinine of 1.  Patient has had progressive hypoxia in the ER today.  Had escalating delivery mechanisms for oxygen.  At this point on heated high flow nasal cannula at 100% and 40 L.  She feels comfortable breathing on this.  She does not feel labored.  She is able to communicate in full sentences.  However O2 sats dropped into the high 70s.  If she rests comfortably her O2 sats maintained at about 88%.  We discussed the utility of self proning.  Patient was self proned in the emergency department on heated high flow and her O2 sats came up to 93%.  Past Medical History   Past Medical History:  Diagnosis Date  . Asthma 06/17/2020  . Eczema   . Fibromyalgia    lyrica-pain in legs and arms-controlled  . Seasonal allergies      Significant Hospital Events   ICU admission  Consults:  Pulmonary critical care  Procedures:    Significant Diagnostic Tests:  Chest x-ray: Bilateral patchy airspace disease concerning for pneumonia.  Micro Data:  COVID-19 PCR: Positive  Antimicrobials:  None  Interim  history/subjective:  Self proning, anxious  Objective   Blood pressure 119/90, pulse 60, temperature 97.6 F (36.4 C), temperature source Axillary, resp. rate (!) 27, weight 90.2 kg, SpO2 (!) 83 %.     Body mass index is 34.4 kg/m.   Vent Mode: PSV FiO2 (%):  [100 %] 100 % PEEP:  [10 cmH20] 10 cmH20 Pressure Support:  [8 cmH20] 8 cmH20   Intake/Output Summary (Last 24 hours) at 06/19/2020 1159 Last data filed at 06/19/2020 0800 Gross per 24 hour  Intake 1172.85 ml  Output 700 ml  Net 472.85 ml   Filed Weights   06/17/20 0900 06/18/20 0500  Weight: 80 kg 90.2 kg    Physical Exam: General: Adult female , no acute distress, self-proning HENT: [place], AT, HHFNC, NRB, SpO2 80s Eyes: EOMI, no scleral icterus Respiratory: Diminished breath sounds bilaterally.  No crackles, wheezing or rales Cardiovascular: RRR, -M/R/G, no JVD Extremities:-Edema,-tenderness Neuro: AA, follows commands, moves extremities x4  Resolved Hospital Problem list     Assessment & Plan:   Acute hypoxemic respiratory failure  COVID-19 pneumonia, bilateral  Severe hypoxemia Currently on 50L HHFNC. Refuses BiPAP.  Plan: Remdesivir, steroids, baricitinib  Self-proning as tolerated Continue to wean FiO2 as tolerated. Decision for intubation should be made if she cannot maintain O2 sats > 85% or develops respiratory distress.  At risk for intubation Potentially a candidate for ECMO should she be intubated, see separate ECMO consult note. Continue to trend inflammatory markers  Comp.ete 5 day course of CAP coverage 8/29  Asthma:  Continue on IV steroids at this time  Anxiety Increase Precedex Start low dose clonazepam  Best practice:  Diet: Clear liquids Pain/Anxiety/Delirium protocol (if indicated): Not applicable VAP protocol (if indicated): Not applicable DVT prophylaxis: Lovenox GI prophylaxis: H2 blocker Glucose control: Covid glycemic protocol Mobility: Bedrest, self proning Code Status:  Full code Family Communication: Patient updated at bedside 8/28 Disposition: ICU  Labs   CBC: Recent Labs  Lab 06/08/2020 2224 06/17/20 0905 06/18/20 0638 06/19/20 0723  WBC 11.0* 8.8 13.6* 15.2*  NEUTROABS 9.7* 8.0* 11.8* 13.2*  HGB 15.0 14.1 12.8 12.4  HCT 48.9* 44.7 40.4 40.9  MCV 90.1 89.2 88.4 89.3  PLT 200 196 208 213    Basic Metabolic Panel: Recent Labs  Lab 06/10/2020 2224 06/17/20 0905 06/18/20 0638 06/19/20 0723 06/19/20 1001  NA 140 139 144  --  144  K 3.9 4.2 4.0  --  4.4  CL 106 106 111  --  110  CO2 20* 22 23  --  26  GLUCOSE 171* 259* 155*  --  177*  BUN 13 13 12   --  19  CREATININE 1.11* 1.06* 0.87  --  0.84  CALCIUM 8.7* 8.0* 7.6*  --  7.8*  MG  --  1.6* 1.9 2.3  --   PHOS  --  2.0* 2.3* 2.5  --    GFR: CrCl cannot be calculated (Unknown ideal weight.). Recent Labs  Lab 06/03/2020 2224 06/17/20 0905 06/18/20 0638 06/19/20 0723  PROCALCITON 1.67  --   --   --   WBC 11.0* 8.8 13.6* 15.2*  LATICACIDVEN 1.0  --   --   --     Liver Function Tests: Recent Labs  Lab 05/31/2020 2224 06/17/20 0905 06/18/20 0638 06/19/20 1001  AST 54* 41 36 29  ALT 41 33 27 25  ALKPHOS 57 46 45 58  BILITOT 0.6 0.5 0.4 0.4  PROT 6.7 5.9* 5.6* 5.9*  ALBUMIN 2.8* 2.2* 2.1* 2.2*   No results for input(s): LIPASE, AMYLASE in the last 168 hours. No results for input(s): AMMONIA in the last 168 hours.  ABG No results found for: PHART, PCO2ART, PO2ART, HCO3, TCO2, ACIDBASEDEF, O2SAT   Coagulation Profile: No results for input(s): INR, PROTIME in the last 168 hours.  Cardiac Enzymes: No results for input(s): CKTOTAL, CKMB, CKMBINDEX, TROPONINI in the last 168 hours.  HbA1C: Hgb A1c MFr Bld  Date/Time Value Ref Range Status  06/18/2020 06:38 AM 5.6 4.8 - 5.6 % Final    Comment:    (NOTE) Pre diabetes:          5.7%-6.4%  Diabetes:              >6.4%  Glycemic control for   <7.0% adults with diabetes     CBG: Recent Labs  Lab 06/18/20 2029  06/18/20 2355 06/19/20 0309 06/19/20 0818 06/19/20 1149  GLUCAP 180* 128* 137* 150* 157*    The patient is critically ill with multiple organ systems failure and requires high complexity decision making for assessment and support, frequent evaluation and titration of therapies, application of advanced monitoring technologies and extensive interpretation of multiple databases.  Independent Critical Care Time: 33 Minutes.   Rodman Pickle, M.D. [facility]monary[facility]Critical Care Medicine 06/19/2020 11:59 AM   Please see Amion for pager number to reach on-call Pulmonary and Critical Care Team.

## 2020-06-19 NOTE — Progress Notes (Signed)
RT @ bedside notifies this rn pt with increased anxiety noncompliance and removing HFNC and NRB, oxygen sats 66%. This rn notes pt ox4, precedex increase to max of 1.2mg /hr. RT placed pt on bipap with increase of sat to 71%. This rn notified dr Loanne Drilling of event.

## 2020-06-20 ENCOUNTER — Inpatient Hospital Stay (HOSPITAL_COMMUNITY): Payer: BC Managed Care – PPO

## 2020-06-20 LAB — POCT I-STAT 7, (LYTES, BLD GAS, ICA,H+H)
Acid-Base Excess: 0 mmol/L (ref 0.0–2.0)
Acid-base deficit: 2 mmol/L (ref 0.0–2.0)
Acid-base deficit: 2 mmol/L (ref 0.0–2.0)
Bicarbonate: 25 mmol/L (ref 20.0–28.0)
Bicarbonate: 26.9 mmol/L (ref 20.0–28.0)
Bicarbonate: 29.4 mmol/L — ABNORMAL HIGH (ref 20.0–28.0)
Calcium, Ion: 1.13 mmol/L — ABNORMAL LOW (ref 1.15–1.40)
Calcium, Ion: 1.14 mmol/L — ABNORMAL LOW (ref 1.15–1.40)
Calcium, Ion: 1.11 mmol/L — ABNORMAL LOW (ref 1.15–1.40)
HCT: 39 % (ref 36.0–46.0)
HCT: 39 % (ref 36.0–46.0)
HCT: 42 % (ref 36.0–46.0)
Hemoglobin: 13.3 g/dL (ref 12.0–15.0)
Hemoglobin: 13.3 g/dL (ref 12.0–15.0)
Hemoglobin: 14.3 g/dL (ref 12.0–15.0)
O2 Saturation: 77 %
O2 Saturation: 89 %
O2 Saturation: 91 %
Patient temperature: 98.6
Patient temperature: 97.4
Patient temperature: 99.2
Potassium: 4.5 mmol/L (ref 3.5–5.1)
Potassium: 4.3 mmol/L (ref 3.5–5.1)
Potassium: 4.7 mmol/L (ref 3.5–5.1)
Sodium: 148 mmol/L — ABNORMAL HIGH (ref 135–145)
Sodium: 149 mmol/L — ABNORMAL HIGH (ref 135–145)
Sodium: 148 mmol/L — ABNORMAL HIGH (ref 135–145)
TCO2: 26 mmol/L (ref 22–32)
TCO2: 29 mmol/L (ref 22–32)
TCO2: 32 mmol/L (ref 22–32)
pCO2 arterial: 40.9 mmHg (ref 32.0–48.0)
pCO2 arterial: 60.9 mmHg — ABNORMAL HIGH (ref 32.0–48.0)
pCO2 arterial: 85.3 mmHg (ref 32.0–48.0)
pH, Arterial: 7.393 (ref 7.350–7.450)
pH, Arterial: 7.25 — ABNORMAL LOW (ref 7.350–7.450)
pH, Arterial: 7.148 — CL (ref 7.350–7.450)
pO2, Arterial: 42 mmHg — ABNORMAL LOW (ref 83.0–108.0)
pO2, Arterial: 66 mmHg — ABNORMAL LOW (ref 83.0–108.0)
pO2, Arterial: 85 mmHg (ref 83.0–108.0)

## 2020-06-20 LAB — COMPREHENSIVE METABOLIC PANEL
ALT: 20 U/L (ref 0–44)
AST: 27 U/L (ref 15–41)
Albumin: 2 g/dL — ABNORMAL LOW (ref 3.5–5.0)
Alkaline Phosphatase: 79 U/L (ref 38–126)
Anion gap: 11 (ref 5–15)
BUN: 31 mg/dL — ABNORMAL HIGH (ref 6–20)
CO2: 23 mmol/L (ref 22–32)
Calcium: 7.8 mg/dL — ABNORMAL LOW (ref 8.9–10.3)
Chloride: 112 mmol/L — ABNORMAL HIGH (ref 98–111)
Creatinine, Ser: 0.89 mg/dL (ref 0.44–1.00)
GFR calc Af Amer: >60 mL/min (ref >60)
GFR calc non Af Amer: >60 mL/min (ref >60)
Glucose, Bld: 191 mg/dL — ABNORMAL HIGH (ref 70–99)
Potassium: 4.7 mmol/L (ref 3.5–5.1)
Sodium: 146 mmol/L — ABNORMAL HIGH (ref 135–145)
Total Bilirubin: 0.2 mg/dL — ABNORMAL LOW (ref 0.3–1.2)
Total Protein: 5.6 g/dL — ABNORMAL LOW (ref 6.5–8.1)

## 2020-06-20 LAB — CBC WITH DIFFERENTIAL/PLATELET
Abs Immature Granulocytes: 0.32 10*3/uL — ABNORMAL HIGH (ref 0.00–0.07)
Basophils Absolute: 0 10*3/uL (ref 0.0–0.1)
Basophils Relative: 0 %
Eosinophils Absolute: 0 10*3/uL (ref 0.0–0.5)
Eosinophils Relative: 0 %
HCT: 45.6 % (ref 36.0–46.0)
Hemoglobin: 13.2 g/dL (ref 12.0–15.0)
Immature Granulocytes: 2 %
Lymphocytes Relative: 6 %
Lymphs Abs: 1.1 10*3/uL (ref 0.7–4.0)
MCH: 27.2 pg (ref 26.0–34.0)
MCHC: 28.9 g/dL — ABNORMAL LOW (ref 30.0–36.0)
MCV: 94 fL (ref 80.0–100.0)
Monocytes Absolute: 0.9 10*3/uL (ref 0.1–1.0)
Monocytes Relative: 5 %
Neutro Abs: 15.1 10*3/uL — ABNORMAL HIGH (ref 1.7–7.7)
Neutrophils Relative %: 87 %
Platelets: 131 10*3/uL — ABNORMAL LOW (ref 150–400)
RBC: 4.85 MIL/uL (ref 3.87–5.11)
RDW: 13.9 % (ref 11.5–15.5)
WBC: 17.5 10*3/uL — ABNORMAL HIGH (ref 4.0–10.5)
nRBC: 0 % (ref 0.0–0.2)

## 2020-06-20 LAB — PHOSPHORUS
Phosphorus: 3 mg/dL (ref 2.5–4.6)
Phosphorus: 4.1 mg/dL (ref 2.5–4.6)
Phosphorus: 5.3 mg/dL — ABNORMAL HIGH (ref 2.5–4.6)

## 2020-06-20 LAB — GLUCOSE, CAPILLARY
Glucose-Capillary: 179 mg/dL — ABNORMAL HIGH (ref 70–99)
Glucose-Capillary: 182 mg/dL — ABNORMAL HIGH (ref 70–99)
Glucose-Capillary: 189 mg/dL — ABNORMAL HIGH (ref 70–99)
Glucose-Capillary: 211 mg/dL — ABNORMAL HIGH (ref 70–99)
Glucose-Capillary: 221 mg/dL — ABNORMAL HIGH (ref 70–99)
Glucose-Capillary: 264 mg/dL — ABNORMAL HIGH (ref 70–99)

## 2020-06-20 LAB — SEDIMENTATION RATE: Sed Rate: 26 mm/hr — ABNORMAL HIGH (ref 0–22)

## 2020-06-20 LAB — FIBRINOGEN: Fibrinogen: 295 mg/dL (ref 210–475)

## 2020-06-20 LAB — MAGNESIUM
Magnesium: 2.7 mg/dL — ABNORMAL HIGH (ref 1.7–2.4)
Magnesium: 2.6 mg/dL — ABNORMAL HIGH (ref 1.7–2.4)
Magnesium: 2.8 mg/dL — ABNORMAL HIGH (ref 1.7–2.4)

## 2020-06-20 LAB — FERRITIN: Ferritin: 632 ng/mL — ABNORMAL HIGH (ref 11–307)

## 2020-06-20 LAB — C-REACTIVE PROTEIN: CRP: 10.1 mg/dL — ABNORMAL HIGH (ref <1.0)

## 2020-06-20 LAB — D-DIMER, QUANTITATIVE: D-Dimer, Quant: >20 ug/mL-FEU — ABNORMAL HIGH (ref 0.00–0.50)

## 2020-06-20 LAB — LACTATE DEHYDROGENASE: LDH: 976 U/L — ABNORMAL HIGH (ref 98–192)

## 2020-06-20 MED ORDER — FENTANYL BOLUS VIA INFUSION
50.0000 ug | INTRAVENOUS | Status: DC | PRN
  Administered 2020-06-20 – 2020-06-22 (×4): 50 ug via INTRAVENOUS
  Filled 2020-06-20: qty 50

## 2020-06-20 MED ORDER — NOREPINEPHRINE 4 MG/250ML-% IV SOLN: 2.0000 ug/min | INTRAVENOUS | Status: DC

## 2020-06-20 MED ORDER — POLYETHYLENE GLYCOL 3350 17 G PO PACK
17.0000 g | PACK | Freq: Every day | ORAL | Status: DC
  Administered 2020-06-21 – 2020-06-25 (×4): 17 g
  Filled 2020-06-20 (×5): qty 1

## 2020-06-20 MED ORDER — SODIUM CHLORIDE 0.9 % IV BOLUS: 1000.0000 mL | Freq: Once | INTRAVENOUS | Status: DC

## 2020-06-20 MED ORDER — ONDANSETRON HCL 4 MG PO TABS: 4.0000 mg | ORAL_TABLET | Freq: Four times a day (QID) | ORAL | Status: DC | PRN

## 2020-06-20 MED ORDER — FENTANYL CITRATE (PF) 100 MCG/2ML IJ SOLN
50.0000 ug | Freq: Once | INTRAMUSCULAR | Status: AC
  Administered 2020-06-20: 50 ug via INTRAVENOUS

## 2020-06-20 MED ORDER — FREE WATER
200.0000 mL | Status: DC
  Administered 2020-06-20 – 2020-06-21 (×6): 200 mL

## 2020-06-20 MED ORDER — VITAL HIGH PROTEIN PO LIQD
1000.0000 mL | ORAL | Status: DC
  Administered 2020-06-20: 1000 mL

## 2020-06-20 MED ORDER — PROPOFOL 1000 MG/100ML IV EMUL
0.0000 ug/kg/min | INTRAVENOUS | Status: DC
  Administered 2020-06-20 (×2): 30 ug/kg/min via INTRAVENOUS
  Administered 2020-06-20: 35 ug/kg/min via INTRAVENOUS
  Administered 2020-06-21 – 2020-06-22 (×10): 40 ug/kg/min via INTRAVENOUS
  Administered 2020-06-23 (×2): 30 ug/kg/min via INTRAVENOUS
  Filled 2020-06-20 (×16): qty 100

## 2020-06-20 MED ORDER — FENTANYL CITRATE (PF) 100 MCG/2ML IJ SOLN
INTRAMUSCULAR | Status: AC
  Administered 2020-06-20: 100 ug
  Filled 2020-06-20: qty 2

## 2020-06-20 MED ORDER — BUDESONIDE 0.5 MG/2ML IN SUSP
0.5000 mg | Freq: Two times a day (BID) | RESPIRATORY_TRACT | Status: DC
  Administered 2020-06-20 – 2020-06-25 (×12): 0.5 mg via RESPIRATORY_TRACT
  Filled 2020-06-20 (×12): qty 2

## 2020-06-20 MED ORDER — GUAIFENESIN-DM 100-10 MG/5ML PO SYRP
10.0000 mL | ORAL_SOLUTION | ORAL | Status: DC | PRN
  Filled 2020-06-20: qty 10

## 2020-06-20 MED ORDER — IPRATROPIUM-ALBUTEROL 0.5-2.5 (3) MG/3ML IN SOLN
3.0000 mL | Freq: Four times a day (QID) | RESPIRATORY_TRACT | Status: DC
  Administered 2020-06-20 (×2): 3 mL via RESPIRATORY_TRACT
  Filled 2020-06-20 (×2): qty 3

## 2020-06-20 MED ORDER — DIPHENHYDRAMINE HCL 12.5 MG/5ML PO ELIX: 25.0000 mg | ORAL_SOLUTION | Freq: Four times a day (QID) | ORAL | Status: DC | PRN

## 2020-06-20 MED ORDER — FENTANYL 2500MCG IN NS 250ML (10MCG/ML) PREMIX INFUSION
50.0000 ug/h | INTRAVENOUS | Status: DC
  Administered 2020-06-20: 50 ug/h via INTRAVENOUS
  Administered 2020-06-20: 200 ug/h via INTRAVENOUS
  Administered 2020-06-21 – 2020-06-22 (×4): 250 ug/h via INTRAVENOUS
  Administered 2020-06-23: 225 ug/h via INTRAVENOUS
  Administered 2020-06-23: 250 ug/h via INTRAVENOUS
  Administered 2020-06-24 (×2): 225 ug/h via INTRAVENOUS
  Administered 2020-06-25: 200 ug/h via INTRAVENOUS
  Administered 2020-06-25: 225 ug/h via INTRAVENOUS
  Filled 2020-06-20 (×11): qty 250

## 2020-06-20 MED ORDER — ACETAMINOPHEN 325 MG PO TABS
650.0000 mg | ORAL_TABLET | Freq: Four times a day (QID) | ORAL | Status: DC | PRN
  Administered 2020-06-20 – 2020-06-25 (×4): 650 mg
  Filled 2020-06-20 (×4): qty 2

## 2020-06-20 MED ORDER — ONDANSETRON HCL 4 MG/2ML IJ SOLN: 4.0000 mg | Freq: Four times a day (QID) | INTRAMUSCULAR | Status: DC | PRN

## 2020-06-20 MED ORDER — ORAL CARE MOUTH RINSE
15.0000 mL | OROMUCOSAL | Status: DC
  Administered 2020-06-20 – 2020-06-25 (×53): 15 mL via OROMUCOSAL

## 2020-06-20 MED ORDER — VITAMIN B-12 1000 MCG PO TABS
1000.0000 ug | ORAL_TABLET | Freq: Every day | ORAL | Status: DC
  Administered 2020-06-21 – 2020-06-25 (×5): 1000 ug
  Filled 2020-06-20 (×6): qty 1

## 2020-06-20 MED ORDER — VECURONIUM BROMIDE 10 MG IV SOLR
0.1000 mg/kg | INTRAVENOUS | Status: DC | PRN
  Administered 2020-06-20: 9.2 mg via INTRAVENOUS
  Filled 2020-06-20 (×2): qty 10

## 2020-06-20 MED ORDER — NOREPINEPHRINE 4 MG/250ML-% IV SOLN
0.0000 ug/min | INTRAVENOUS | Status: DC
  Administered 2020-06-20: 2 ug/min via INTRAVENOUS
  Administered 2020-06-23: 11 ug/min via INTRAVENOUS
  Administered 2020-06-23: 20 ug/min via INTRAVENOUS
  Filled 2020-06-20 (×3): qty 250

## 2020-06-20 MED ORDER — VITAL AF 1.2 CAL PO LIQD
1000.0000 mL | ORAL | Status: DC
  Administered 2020-06-20 – 2020-06-24 (×4): 1000 mL

## 2020-06-20 MED ORDER — ETOMIDATE 2 MG/ML IV SOLN
20.0000 mg | Freq: Once | INTRAVENOUS | Status: AC
  Administered 2020-06-20: 20 mg via INTRAVENOUS

## 2020-06-20 MED ORDER — MONTELUKAST SODIUM 10 MG PO TABS
10.0000 mg | ORAL_TABLET | Freq: Every day | ORAL | Status: DC
  Administered 2020-06-20 – 2020-06-24 (×5): 10 mg
  Filled 2020-06-20 (×5): qty 1

## 2020-06-20 MED ORDER — PHENYLEPHRINE HCL-NACL 10-0.9 MG/250ML-% IV SOLN
INTRAVENOUS | Status: AC
  Filled 2020-06-20: qty 250

## 2020-06-20 MED ORDER — ROCURONIUM BROMIDE 50 MG/5ML IV SOLN
100.0000 mg | Freq: Once | INTRAVENOUS | Status: AC
  Administered 2020-06-20: 100 mg via INTRAVENOUS

## 2020-06-20 MED ORDER — MIDAZOLAM HCL 2 MG/2ML IJ SOLN
INTRAMUSCULAR | Status: AC
  Administered 2020-06-20: 2 mg
  Filled 2020-06-20: qty 2

## 2020-06-20 MED ORDER — PROSOURCE TF PO LIQD
45.0000 mL | Freq: Four times a day (QID) | ORAL | Status: DC
  Administered 2020-06-20 – 2020-06-25 (×20): 45 mL
  Filled 2020-06-20 (×20): qty 45

## 2020-06-20 MED ORDER — LINAGLIPTIN 5 MG PO TABS
5.0000 mg | ORAL_TABLET | Freq: Every day | ORAL | Status: DC
  Administered 2020-06-21 – 2020-06-25 (×5): 5 mg
  Filled 2020-06-20 (×6): qty 1

## 2020-06-20 MED ORDER — POLYETHYLENE GLYCOL 3350 17 G PO PACK
17.0000 g | PACK | Freq: Every day | ORAL | Status: DC
  Filled 2020-06-20: qty 1

## 2020-06-20 MED ORDER — VECURONIUM BROMIDE 10 MG IV SOLR
INTRAVENOUS | Status: AC
  Administered 2020-06-20: 10 mg
  Filled 2020-06-20: qty 10

## 2020-06-20 MED ORDER — DOCUSATE SODIUM 50 MG/5ML PO LIQD
100.0000 mg | Freq: Two times a day (BID) | ORAL | Status: DC
  Administered 2020-06-20 – 2020-06-25 (×10): 100 mg
  Filled 2020-06-20 (×11): qty 10

## 2020-06-20 MED ORDER — DOCUSATE SODIUM 50 MG/5ML PO LIQD: 100.0000 mg | Freq: Two times a day (BID) | ORAL | Status: DC

## 2020-06-20 MED ORDER — CHLORHEXIDINE GLUCONATE 0.12% ORAL RINSE (MEDLINE KIT)
15.0000 mL | Freq: Two times a day (BID) | OROMUCOSAL | Status: DC
  Administered 2020-06-20 – 2020-06-25 (×12): 15 mL via OROMUCOSAL

## 2020-06-20 MED ORDER — PREGABALIN 100 MG PO CAPS
200.0000 mg | ORAL_CAPSULE | Freq: Every day | ORAL | Status: DC
  Administered 2020-06-21 – 2020-06-23 (×3): 200 mg
  Filled 2020-06-20 (×4): qty 2

## 2020-06-20 MED ORDER — ENOXAPARIN SODIUM 60 MG/0.6ML ~~LOC~~ SOLN
45.0000 mg | Freq: Every day | SUBCUTANEOUS | Status: DC
  Administered 2020-06-21: 45 mg via SUBCUTANEOUS
  Filled 2020-06-20: qty 0.6

## 2020-06-20 MED ORDER — ZINC SULFATE 220 (50 ZN) MG PO CAPS
220.0000 mg | ORAL_CAPSULE | Freq: Every day | ORAL | Status: DC
  Administered 2020-06-21 – 2020-06-25 (×5): 220 mg
  Filled 2020-06-20 (×6): qty 1

## 2020-06-20 MED ORDER — ASCORBIC ACID 500 MG PO TABS
500.0000 mg | ORAL_TABLET | Freq: Every day | ORAL | Status: DC
  Administered 2020-06-21 – 2020-06-25 (×5): 500 mg
  Filled 2020-06-20 (×6): qty 1

## 2020-06-20 MED ORDER — POLYETHYLENE GLYCOL 3350 17 G PO PACK: 17.0000 g | PACK | Freq: Every day | ORAL | Status: DC | PRN

## 2020-06-20 MED ORDER — FUROSEMIDE 10 MG/ML IJ SOLN
40.0000 mg | Freq: Once | INTRAMUSCULAR | Status: AC
  Administered 2020-06-20: 40 mg via INTRAVENOUS
  Filled 2020-06-20: qty 4

## 2020-06-20 MED ORDER — BARICITINIB 2 MG PO TABS
4.0000 mg | ORAL_TABLET | Freq: Every day | ORAL | Status: DC
  Administered 2020-06-21: 4 mg
  Filled 2020-06-20 (×2): qty 2

## 2020-06-20 MED ORDER — ACETAMINOPHEN 650 MG RE SUPP
650.0000 mg | Freq: Four times a day (QID) | RECTAL | Status: DC | PRN
  Administered 2020-06-21: 650 mg via RECTAL
  Filled 2020-06-20: qty 1

## 2020-06-20 MED ORDER — POLYETHYLENE GLYCOL 3350 17 G PO PACK
17.0000 g | PACK | Freq: Every day | ORAL | Status: DC
  Administered 2020-06-20: 17 g via ORAL

## 2020-06-20 MED ORDER — DOCUSATE SODIUM 50 MG/5ML PO LIQD: 100.0000 mg | Freq: Two times a day (BID) | ORAL | Status: DC | PRN

## 2020-06-20 MED ORDER — CLONAZEPAM 0.5 MG PO TABS: 0.5000 mg | ORAL_TABLET | Freq: Two times a day (BID) | ORAL | Status: DC | PRN

## 2020-06-20 MED ORDER — PROSOURCE TF PO LIQD
45.0000 mL | Freq: Two times a day (BID) | ORAL | Status: DC
  Administered 2020-06-20: 45 mL
  Filled 2020-06-20: qty 45

## 2020-06-20 MED ORDER — ARTIFICIAL TEARS OPHTHALMIC OINT
1.0000 "application " | TOPICAL_OINTMENT | Freq: Three times a day (TID) | OPHTHALMIC | Status: DC
  Administered 2020-06-20 – 2020-06-22 (×5): 1 via OPHTHALMIC
  Filled 2020-06-20: qty 3.5

## 2020-06-20 MED ORDER — IPRATROPIUM-ALBUTEROL 0.5-2.5 (3) MG/3ML IN SOLN
3.0000 mL | RESPIRATORY_TRACT | Status: DC
  Administered 2020-06-20 – 2020-06-22 (×9): 3 mL via RESPIRATORY_TRACT
  Filled 2020-06-20 (×8): qty 3

## 2020-06-20 MED ORDER — IPRATROPIUM-ALBUTEROL 0.5-2.5 (3) MG/3ML IN SOLN: 3.0000 mL | Freq: Four times a day (QID) | RESPIRATORY_TRACT | Status: DC | PRN

## 2020-06-20 NOTE — Progress Notes (Signed)
eLink Physician-Brief Progress Note Patient Name: SMT LOKEY DOB: [birth year] MRN: [id number]   Date of Service  06/20/2020  HPI/Events of Note  RN requests additional sedation/analgesia for this proned and intubated patient. Also, last ABG 7.15/85/85/29.4/BE +2. Current RR set to 30 and she is not breathing over this.   eICU Interventions  Increased set ventilator RR to 32/min. Increased upper limit of fentanyl to 250.     Intervention Category Major Interventions: Respiratory failure - evaluation and management Intermediate Interventions: Pain - evaluation and management  Charlott Rakes 06/20/2020, 9:39 PM

## 2020-06-20 NOTE — Progress Notes (Signed)
Pt placed in prone position without complications.  ET tube securely taped with cloth tape.  Head placed in proper position and supported by specialty pillow.  ET tube is free and unobstructed.

## 2020-06-20 NOTE — Progress Notes (Addendum)
NAME:  Lisa [name], MRN:  [id number], DOB:  08-13-1976, LOS: 3 ADMISSION DATE:  05/25/2020, CONSULTATION DATE: 06/17/2020 REFERRING MD: Dr. Aleene Davidson, CHIEF COMPLAINT: COVID-19 pneumonia  Brief History   44 year old female, no significant past medical history admitted for bilateral pneumonia, COVID-19 positive.  Pulmonary critical care consulted for evaluation as she is on 100% heated high flow 40 L.  History of present illness   This is a 44 year old female with no significant past medical history except for asthma, BMI 30, recent sick contact, daughter testing positive for COVID-19.  Patient was diagnosed on Monday symptoms started last weekend after known sick contact with patient's daughter who tested positive she was found to be hypoxic in the emergency department.  Initial labs revealed elevated white count of 11,000, initial CRP of 22, D-dimer of 0.85, glucose 171, serum creatinine of 1.  Patient has had progressive hypoxia in the ER today.  Had escalating delivery mechanisms for oxygen.  At this point on heated high flow nasal cannula at 100% and 40 L.  She feels comfortable breathing on this.  She does not feel labored.  She is able to communicate in full sentences.  However O2 sats dropped into the high 70s.  If she rests comfortably her O2 sats maintained at about 88%.  We discussed the utility of self proning.  Patient was self proned in the emergency department on heated high flow and her O2 sats came up to 93%.  Past Medical History   Past Medical History:  Diagnosis Date  . Asthma 06/17/2020  . Eczema   . Fibromyalgia    lyrica-pain in legs and arms-controlled  . Seasonal allergies      Significant Hospital Events   ICU admission  Consults:  Pulmonary critical care  Procedures:  ETT 8/29  Significant Diagnostic Tests:  Chest x-ray: Bilateral patchy airspace disease concerning for pneumonia.  Micro Data:  COVID-19 PCR: Positive  Antimicrobials:  None  Interim  history/subjective:  Worsening hypoxemia despite BiPAP use overnight. Is fatigued. We discussed intubation and patient agrees to plan. I also spoke with husband via telephone and updated him on her respiratory status and need for intubation. He expressed understanding.  Objective   Blood pressure (!) 145/98, pulse (!) 59, temperature (!) 97.4 F (36.3 C), temperature source Axillary, resp. rate (!) 31, weight 91.9 kg, SpO2 (!) 76 %.     Body mass index is 35.05 kg/m.   Vent Mode: PCV FiO2 (%):  [100 %] 100 % Set Rate:  [10 bmp] 10 bmp PEEP:  [10 cmH20-12 cmH20] 12 cmH20 Pressure Support:  [8 cmH20] 8 cmH20   Intake/Output Summary (Last 24 hours) at 06/20/2020 0825 Last data filed at 06/20/2020 0700 Gross per 24 hour  Intake 983.01 ml  Output 500 ml  Net 483.01 ml   Filed Weights   06/17/20 0900 06/18/20 0500 06/20/20 0500  Weight: 80 kg 90.2 kg 91.9 kg   Physical Exam: General: Adult female on BiPAP, appears fatigued, tearful HENT: [place], AT, wearing BiPAP Eyes: EOMI, no scleral icterus Respiratory: Diminished breath sounds bilaterally Cardiovascular: RRR, -M/R/G, no JVD GI: BS+, soft, nontender Extremities:-Edema,-tenderness Neuro: Awake and alert, moves extremities x 4  Resolved Hospital Problem list     Assessment & Plan:   Acute hypoxemic respiratory failure secondary to COVID-19 pneumonia Intubate now Mechanical ventilation with lung protective strategy Proning protocol for P:F <150 VAP prevention protocol Goal euvolemia: Lasix 40 mg once Remdesivir, steroids, baricitinib  Potentially a candidate for ECMO  should she be intubated, see separate ECMO consult note. Continue to trend inflammatory markers  Complete 5 day course of CAP coverage 8/29 PAD protocol for RASS goal -4  Asthma Continue on IV steroids at this time  Anxiety Sedation as noted above  Best practice:  Diet: NPO Pain/Anxiety/Delirium protocol (if indicated): PAD protocol VAP protocol (if  indicated): Yes DVT prophylaxis: Lovenox GI prophylaxis: H2 blocker Glucose control: Covid glycemic protocol Mobility: As tolerated Code Status: Full code Family Communication: Patient updated at bedside 8/29. Updated husband on telephone 8/29 Disposition: ICU  Labs   CBC: Recent Labs  Lab 06/15/2020 2224 06/19/2020 2224 06/17/20 0905 06/18/20 9604 06/19/20 0723 06/20/20 0404 06/20/20 0628  WBC 11.0*  --  8.8 13.6* 15.2* 17.5*  --   NEUTROABS 9.7*  --  8.0* 11.8* 13.2* 15.1*  --   HGB 15.0   < > 14.1 12.8 12.4 13.2 13.3  HCT 48.9*   < > 44.7 40.4 40.9 45.6 39.0  MCV 90.1  --  89.2 88.4 89.3 94.0  --   PLT 200  --  196 208 216 131*  --    < > = values in this interval not displayed.    Basic Metabolic Panel: Recent Labs  Lab 06/14/2020 2224 06/01/2020 2224 06/17/20 0905 06/18/20 5409 06/19/20 0723 06/19/20 1001 06/20/20 0404 06/20/20 0628  NA 140   < > 139 144  --  144 146* 148*  K 3.9   < > 4.2 4.0  --  4.4 4.7 4.5  CL 106  --  106 111  --  110 112*  --   CO2 20*  --  22 23  --  26 23  --   GLUCOSE 171*  --  259* 155*  --  177* 191*  --   BUN 13  --  13 12  --  19 31*  --   CREATININE 1.11*  --  1.06* 0.87  --  0.84 0.89  --   CALCIUM 8.7*  --  8.0* 7.6*  --  7.8* 7.8*  --   MG  --   --  1.6* 1.9 2.3  --  2.7*  --   PHOS  --   --  2.0* 2.3* 2.5  --  3.0  --    < > = values in this interval not displayed.   GFR: CrCl cannot be calculated (Unknown ideal weight.). Recent Labs  Lab 06/07/2020 2224 06/12/2020 2224 06/17/20 0905 06/18/20 0638 06/19/20 0723 06/20/20 0404  PROCALCITON 1.67  --   --   --   --   --   WBC 11.0*   < > 8.8 13.6* 15.2* 17.5*  LATICACIDVEN 1.0  --   --   --   --   --    < > = values in this interval not displayed.    Liver Function Tests: Recent Labs  Lab 06/02/2020 2224 06/17/20 0905 06/18/20 0638 06/19/20 1001 06/20/20 0404  AST 54* 41 36 29 27  ALT 41 33 27 25 20   ALKPHOS 57 46 45 58 79  BILITOT 0.6 0.5 0.4 0.4 0.2*  PROT 6.7  5.9* 5.6* 5.9* 5.6*  ALBUMIN 2.8* 2.2* 2.1* 2.2* 2.0*   No results for input(s): LIPASE, AMYLASE in the last 168 hours. No results for input(s): AMMONIA in the last 168 hours.  ABG    Component Value Date/Time   PHART 7.393 06/20/2020 0628   PCO2ART 40.9 06/20/2020 0628   PO2ART 42 (L) 06/20/2020  4158   HCO3 25.0 06/20/2020 0628   TCO2 26 06/20/2020 0628   O2SAT 77.0 06/20/2020 0628     Coagulation Profile: No results for input(s): INR, PROTIME in the last 168 hours.  Cardiac Enzymes: No results for input(s): CKTOTAL, CKMB, CKMBINDEX, TROPONINI in the last 168 hours.  HbA1C: Hgb A1c MFr Bld  Date/Time Value Ref Range Status  06/18/2020 06:38 AM 5.6 4.8 - 5.6 % Final    Comment:    (NOTE) Pre diabetes:          5.7%-6.4%  Diabetes:              >6.4%  Glycemic control for   <7.0% adults with diabetes     CBG: Recent Labs  Lab 06/19/20 2131 06/19/20 2255 06/19/20 2338 06/20/20 0145 06/20/20 0403  GLUCAP 191* 168* 160* 179* 182*   The patient is critically ill with multiple organ systems failure and requires high complexity decision making for assessment and support, frequent evaluation and titration of therapies, application of advanced monitoring technologies and extensive interpretation of multiple databases.  Independent Critical Care Time: 60 Minutes.   Rodman Pickle, M.D. [facility] Pulmonary/Critical Care Medicine 06/20/2020 8:33 AM   Please see Amion for pager number to reach on-call Pulmonary and Critical Care Team.

## 2020-06-20 NOTE — Procedures (Signed)
Intubation Procedure Note  Lisa [name]  [id number]  28-Jul-1976  Date:06/20/20  Time:9:11 AM   Provider Performing:Hamsa Laurich Rodman Pickle    Procedure: Intubation (93790)  Indication(s) Respiratory Failure  Consent Risks of the procedure as well as the alternatives and risks of each were explained to the patient and/or caregiver.  Consent for the procedure was obtained and is signed in the bedside chart   Anesthesia Etomidate, Versed, Fentanyl and Rocuronium   Time Out Verified patient identification, verified procedure, site/side was marked, verified correct patient position, special equipment/implants available, medications/allergies/relevant history reviewed, required imaging and test results available.   Sterile Technique Usual hand hygeine, masks, and gloves were used   Procedure Description Patient positioned in bed supine.  Sedation given as noted above.  Patient was intubated with endotracheal tube using Glidescope.  View was Grade 1 full glottis .  Number of attempts was 1.  Colorimetric CO2 detector was consistent with tracheal placement.   Complications/Tolerance Profound hypoxemia. Vent settings adjusted to 8cc/kg increased HOB with improvement of SpO2 to 80s Chest X-ray with ETT in appropriate positioning    EBL Minimal   Specimen(s) None

## 2020-06-20 NOTE — Progress Notes (Signed)
Pt head turned to the left at this time.

## 2020-06-20 NOTE — Progress Notes (Signed)
Patient desaturations remain consistently in low 70s. Patient mucosa pale, and patient dyspnea increasing. E-link notified.  ABG ordered. RT notified.

## 2020-06-20 NOTE — Progress Notes (Signed)
eLink Physician-Brief Progress Note Patient Name: Lisa [name] DOB: September 02, 1976 MRN: [id number]   Date of Service  06/20/2020  HPI/Events of Note  Saturation 75 - 80 % on BIPAP.  eICU Interventions  ABG to objectively evaluate oxygenation, and determine if intubation is required.        Kerry Kass Lonni Dirden 06/20/2020, 6:22 AM

## 2020-06-20 NOTE — Progress Notes (Addendum)
Initial Nutrition Assessment  DOCUMENTATION CODES:   Not applicable  INTERVENTION  Tube feeding:  -Vital AF 1.2 @ 45 ml/hr via OG -45 ml ProSource QID -Free water flushes 200 ml Q4 hours   Provides: 1456 kcals, 125 grams protein, 876 ml free water (2076 ml with flushes)   Kcal exceeds requirement to meet protein needs (total kcal provided with TF + propofol = 1966 kcal)  NUTRITION DIAGNOSIS:   Increased nutrient needs related to acute illness (COVID PNA) as evidenced by estimated needs.  GOAL:   Patient will meet greater than or equal to 90% of their needs  MONITOR:   Vent status, Skin, TF tolerance, Weight trends, Labs, I & O's  REASON FOR ASSESSMENT:   Consult, Ventilator Enteral/tube feeding initiation and management  ASSESSMENT:   Patient with PMH significant for asthma, eczema, and fibromyalgia. Presents this admission with COVID 19 PNA.   Did not tolerate BiPAP this afternoon, subsequently intubated. May require ECMO.On propofol. Hypernatremic, no water flushes order, start at 200 ml Q4 hours (1200 ml). No BM x 3 days. On scheduled bowel regimen.   Admission weight: 90.2 kg  Current weight: 91.9 kg   Patient is currently intubated on ventilator support MV: 11.5 L/min Temp (24hrs), Avg:98.3 F (36.8 C), Min:97.4 F (36.3 C), Max:99.1 F (37.3 C)  Propofol: 19.3 ml/hr- provides 510 kcal from lipids daily   Drips: propofol Medications: colace, SS novolog, levemir, tradjenta, solumedrol, miralax, Vit B12, zinc sulfate Labs: Na 149 (H) Mg 2.7 (H) 137-189  Diet Order:   Diet Order            Diet clear liquid Room service appropriate? Yes; Fluid consistency: Thin  Diet effective now                 EDUCATION NEEDS:   Not appropriate for education at this time  Skin:  Skin Assessment: Reviewed RN Assessment  Last BM:  PTA  Height:   Ht Readings from Last 1 Encounters:  06/20/20 5' 3.75" (1.619 m)    Weight:   Wt Readings from Last 1  Encounters:  06/20/20 91.9 kg    Ideal Body Weight:  54.5 kg AdjBW: 68.8 kg   BMI:  Body mass index is 35.05 kg/m.  Estimated Nutritional Needs:   Kcal:  4403-4742  Protein:  110-135  Fluid:  >/= 1.5 L/day   Mariana Single RD, LDN Clinical Nutrition Pager listed in [place]

## 2020-06-20 NOTE — Progress Notes (Signed)
D-Dimer result reported to e-link

## 2020-06-21 ENCOUNTER — Inpatient Hospital Stay (HOSPITAL_COMMUNITY): Payer: BC Managed Care – PPO

## 2020-06-21 DIAGNOSIS — R609 Edema, unspecified: Secondary | ICD-10-CM

## 2020-06-21 DIAGNOSIS — R7989 Other specified abnormal findings of blood chemistry: Secondary | ICD-10-CM

## 2020-06-21 DIAGNOSIS — M7989 Other specified soft tissue disorders: Secondary | ICD-10-CM

## 2020-06-21 DIAGNOSIS — U071 COVID-19: Secondary | ICD-10-CM

## 2020-06-21 LAB — CBC WITH DIFFERENTIAL/PLATELET
Abs Immature Granulocytes: 1.31 10*3/uL — ABNORMAL HIGH (ref 0.00–0.07)
Basophils Absolute: 0.1 10*3/uL (ref 0.0–0.1)
Basophils Relative: 0 %
Eosinophils Absolute: 0 10*3/uL (ref 0.0–0.5)
Eosinophils Relative: 0 %
HCT: 37.8 % (ref 36.0–46.0)
Hemoglobin: 10.7 g/dL — ABNORMAL LOW (ref 12.0–15.0)
Immature Granulocytes: 10 %
Lymphocytes Relative: 4 %
Lymphs Abs: 0.6 10*3/uL — ABNORMAL LOW (ref 0.7–4.0)
MCH: 28.3 pg (ref 26.0–34.0)
MCHC: 28.3 g/dL — ABNORMAL LOW (ref 30.0–36.0)
MCV: 100 fL (ref 80.0–100.0)
Monocytes Absolute: 0.8 10*3/uL (ref 0.1–1.0)
Monocytes Relative: 6 %
Neutro Abs: 10.9 10*3/uL — ABNORMAL HIGH (ref 1.7–7.7)
Neutrophils Relative %: 80 %
Platelets: 149 10*3/uL — ABNORMAL LOW (ref 150–400)
RBC: 3.78 MIL/uL — ABNORMAL LOW (ref 3.87–5.11)
RDW: 14.5 % (ref 11.5–15.5)
WBC: 13.6 10*3/uL — ABNORMAL HIGH (ref 4.0–10.5)
nRBC: 0.4 % — ABNORMAL HIGH (ref 0.0–0.2)

## 2020-06-21 LAB — LACTATE DEHYDROGENASE: LDH: 751 U/L — ABNORMAL HIGH (ref 98–192)

## 2020-06-21 LAB — SEDIMENTATION RATE: Sed Rate: 21 mm/hr (ref 0–22)

## 2020-06-21 LAB — POCT I-STAT 7, (LYTES, BLD GAS, ICA,H+H)
Acid-base deficit: 4 mmol/L — ABNORMAL HIGH (ref 0.0–2.0)
Acid-base deficit: 4 mmol/L — ABNORMAL HIGH (ref 0.0–2.0)
Acid-base deficit: 4 mmol/L — ABNORMAL HIGH (ref 0.0–2.0)
Bicarbonate: 27.9 mmol/L (ref 20.0–28.0)
Bicarbonate: 26.3 mmol/L (ref 20.0–28.0)
Bicarbonate: 26.8 mmol/L (ref 20.0–28.0)
Calcium, Ion: 1.14 mmol/L — ABNORMAL LOW (ref 1.15–1.40)
Calcium, Ion: 1.1 mmol/L — ABNORMAL LOW (ref 1.15–1.40)
Calcium, Ion: 1.11 mmol/L — ABNORMAL LOW (ref 1.15–1.40)
HCT: 40 % (ref 36.0–46.0)
HCT: 39 % (ref 36.0–46.0)
HCT: 39 % (ref 36.0–46.0)
Hemoglobin: 13.6 g/dL (ref 12.0–15.0)
Hemoglobin: 13.3 g/dL (ref 12.0–15.0)
Hemoglobin: 13.3 g/dL (ref 12.0–15.0)
O2 Saturation: 86 %
O2 Saturation: 90 %
O2 Saturation: 94 %
Patient temperature: 102.9
Potassium: 4.5 mmol/L (ref 3.5–5.1)
Potassium: 4.8 mmol/L (ref 3.5–5.1)
Potassium: 4.8 mmol/L (ref 3.5–5.1)
Sodium: 149 mmol/L — ABNORMAL HIGH (ref 135–145)
Sodium: 144 mmol/L (ref 135–145)
Sodium: 145 mmol/L (ref 135–145)
TCO2: 30 mmol/L (ref 22–32)
TCO2: 29 mmol/L (ref 22–32)
TCO2: 29 mmol/L (ref 22–32)
pCO2 arterial: 84.8 mmHg (ref 32.0–48.0)
pCO2 arterial: 76.3 mmHg (ref 32.0–48.0)
pCO2 arterial: 88.7 mmHg (ref 32.0–48.0)
pH, Arterial: 7.125 — CL (ref 7.350–7.450)
pH, Arterial: 7.145 — CL (ref 7.350–7.450)
pH, Arterial: 7.102 — CL (ref 7.350–7.450)
pO2, Arterial: 71 mmHg — ABNORMAL LOW (ref 83.0–108.0)
pO2, Arterial: 77 mmHg — ABNORMAL LOW (ref 83.0–108.0)
pO2, Arterial: 108 mmHg (ref 83.0–108.0)

## 2020-06-21 LAB — MAGNESIUM
Magnesium: 3.1 mg/dL — ABNORMAL HIGH (ref 1.7–2.4)
Magnesium: 3.1 mg/dL — ABNORMAL HIGH (ref 1.7–2.4)

## 2020-06-21 LAB — GLUCOSE, CAPILLARY
Glucose-Capillary: 278 mg/dL — ABNORMAL HIGH (ref 70–99)
Glucose-Capillary: 311 mg/dL — ABNORMAL HIGH (ref 70–99)
Glucose-Capillary: 330 mg/dL — ABNORMAL HIGH (ref 70–99)
Glucose-Capillary: 335 mg/dL — ABNORMAL HIGH (ref 70–99)
Glucose-Capillary: 343 mg/dL — ABNORMAL HIGH (ref 70–99)
Glucose-Capillary: 356 mg/dL — ABNORMAL HIGH (ref 70–99)

## 2020-06-21 LAB — PHOSPHORUS
Phosphorus: 5.1 mg/dL — ABNORMAL HIGH (ref 2.5–4.6)
Phosphorus: 4.8 mg/dL — ABNORMAL HIGH (ref 2.5–4.6)

## 2020-06-21 LAB — COMPREHENSIVE METABOLIC PANEL
ALT: 16 U/L (ref 0–44)
AST: 16 U/L (ref 15–41)
Albumin: 2 g/dL — ABNORMAL LOW (ref 3.5–5.0)
Alkaline Phosphatase: 75 U/L (ref 38–126)
Anion gap: 9 (ref 5–15)
BUN: 42 mg/dL — ABNORMAL HIGH (ref 6–20)
CO2: 27 mmol/L (ref 22–32)
Calcium: 7.6 mg/dL — ABNORMAL LOW (ref 8.9–10.3)
Chloride: 111 mmol/L (ref 98–111)
Creatinine, Ser: 1.42 mg/dL — ABNORMAL HIGH (ref 0.44–1.00)
GFR calc Af Amer: 53 mL/min — ABNORMAL LOW (ref >60)
GFR calc non Af Amer: 45 mL/min — ABNORMAL LOW (ref >60)
Glucose, Bld: 388 mg/dL — ABNORMAL HIGH (ref 70–99)
Potassium: 4.5 mmol/L (ref 3.5–5.1)
Sodium: 147 mmol/L — ABNORMAL HIGH (ref 135–145)
Total Bilirubin: 0.5 mg/dL (ref 0.3–1.2)
Total Protein: 5.3 g/dL — ABNORMAL LOW (ref 6.5–8.1)

## 2020-06-21 LAB — FIBRINOGEN: Fibrinogen: 246 mg/dL (ref 210–475)

## 2020-06-21 LAB — TRIGLYCERIDES
Triglycerides: 845 mg/dL — ABNORMAL HIGH (ref <150)
Triglycerides: 412 mg/dL — ABNORMAL HIGH (ref <150)

## 2020-06-21 LAB — D-DIMER, QUANTITATIVE: D-Dimer, Quant: >20 ug/mL-FEU — ABNORMAL HIGH (ref 0.00–0.50)

## 2020-06-21 LAB — HEPARIN LEVEL (UNFRACTIONATED): Heparin Unfractionated: 2.02 IU/mL — ABNORMAL HIGH (ref 0.30–0.70)

## 2020-06-21 LAB — C-REACTIVE PROTEIN: CRP: 5.7 mg/dL — ABNORMAL HIGH (ref <1.0)

## 2020-06-21 LAB — FERRITIN: Ferritin: 387 ng/mL — ABNORMAL HIGH (ref 11–307)

## 2020-06-21 MED ORDER — FREE WATER
250.0000 mL | Status: DC
  Administered 2020-06-21 – 2020-06-23 (×13): 250 mL

## 2020-06-21 MED ORDER — SODIUM CHLORIDE 0.9 % IV SOLN: INTRAVENOUS | Status: DC | PRN

## 2020-06-21 MED ORDER — BARICITINIB 2 MG PO TABS
2.0000 mg | ORAL_TABLET | Freq: Every day | ORAL | Status: DC
  Administered 2020-06-22: 2 mg
  Filled 2020-06-21: qty 1

## 2020-06-21 MED ORDER — HEPARIN (PORCINE) 25000 UT/250ML-% IV SOLN
1100.0000 [IU]/h | INTRAVENOUS | Status: DC
  Administered 2020-06-21: 1100 [IU]/h via INTRAVENOUS
  Filled 2020-06-21: qty 250

## 2020-06-21 MED ORDER — INSULIN ASPART 100 UNIT/ML ~~LOC~~ SOLN
4.0000 [IU] | SUBCUTANEOUS | Status: DC
  Administered 2020-06-21 – 2020-06-22 (×4): 4 [IU] via SUBCUTANEOUS

## 2020-06-21 MED ORDER — HEPARIN (PORCINE) 25000 UT/250ML-% IV SOLN: 900.0000 [IU]/h | INTRAVENOUS | Status: DC

## 2020-06-21 MED ORDER — HEPARIN BOLUS VIA INFUSION
4450.0000 [IU] | Freq: Once | INTRAVENOUS | Status: AC
  Administered 2020-06-21: 4450 [IU] via INTRAVENOUS
  Filled 2020-06-21: qty 4450

## 2020-06-21 NOTE — Progress Notes (Signed)
Inpatient Diabetes Program Recommendations  AACE/ADA: New Consensus Statement on Inpatient Glycemic Control (2015)  Target Ranges:  Prepandial:   less than 140 mg/dL      Peak postprandial:   less than 180 mg/dL (1-2 hours)      Critically ill patients:  140 - 180 mg/dL   Lab Results  Component Value Date   GLUCAP 311 (H) 06/21/2020   HGBA1C 5.6 06/18/2020    Review of Glycemic Control Results for Lisa [name], Lisa [name] (MRN [id number]) as of 06/21/2020 10:46  Ref. Range 06/20/2020 15:35 06/20/2020 19:24 06/20/2020 23:37 06/21/2020 03:35 06/21/2020 08:16  Glucose-Capillary Latest Ref Range: 70 - 99 mg/dL 211 (H) 221 (H) 264 (H) 278 (H) 311 (H)   Diabetes history:  No history   Current orders for Inpatient glycemic control:  Levemir 6 units bid  Novolog 0-9 units q4h Tradjenta 5 mg daily Solumedrol 40 mg bid Vital 1.2 @ 12ml/hr  Inpatient Diabetes Program Recommendations:    Levemir 10 units bid Novolog 3 units q4h tube feed coverage-stop if feeds are held or discontinued  Will continue to follow while inpatient.  Thank you, Reche Dixon, RN, BSN Diabetes Coordinator Inpatient Diabetes Program [phone] (team pager from 8a-5p)

## 2020-06-21 NOTE — Progress Notes (Signed)
Pt placed in prone position at this time. No complications, VS in normal limits. ETT secured in proper position with cloth tape. Pt's head is faced right.

## 2020-06-21 NOTE — Progress Notes (Addendum)
NAME:  Lisa [name], MRN:  [id number], DOB:  12/08/1975, LOS: 4 ADMISSION DATE:  06/09/2020, CONSULTATION DATE: 06/17/2020 REFERRING MD: Dr. Aleene Davidson, CHIEF COMPLAINT: COVID-19 pneumonia  Brief History   44 year old female, no significant past medical history admitted for bilateral pneumonia, COVID-19 positive.  Pulmonary critical care consulted for evaluation as she is on 100% heated high flow 40 L.  History of present illness   This is a 44 year old female with no significant past medical history except for asthma, BMI 30, recent sick contact, daughter testing positive for COVID-19.  Patient was diagnosed on Monday symptoms started last weekend after known sick contact with patient's daughter who tested positive she was found to be hypoxic in the emergency department.  Initial labs revealed elevated white count of 11,000, initial CRP of 22, D-dimer of 0.85, glucose 171, serum creatinine of 1.  Patient has had progressive hypoxia in the ER today.  Had escalating delivery mechanisms for oxygen.  At this point on heated high flow nasal cannula at 100% and 40 L.  She feels comfortable breathing on this.  She does not feel labored.  She is able to communicate in full sentences.  However O2 sats dropped into the high 70s.  If she rests comfortably her O2 sats maintained at about 88%.  We discussed the utility of self proning.  Patient was self proned in the emergency department on heated high flow and her O2 sats came up to 93%.  Past Medical History   Past Medical History:  Diagnosis Date  . Asthma 06/17/2020  . Eczema   . Fibromyalgia    lyrica-pain in legs and arms-controlled  . Seasonal allergies      Significant Hospital Events   ICU admission  Consults:  Pulmonary critical care  Procedures:  ETT 8/29  Significant Diagnostic Tests:  Chest x-ray: Bilateral patchy airspace disease concerning for pneumonia.  Micro Data:  COVID-19 PCR: Positive  Antimicrobials:  None  Interim  history/subjective:  Intubated and proned yesterday. Febrile this am. FIO2 weaned to 80%. Required 1 dose of vec overnight. No further paralytics needed  Objective   Blood pressure (!) 99/55, pulse (!) 133, temperature (!) 101.4 F (38.6 C), temperature source Rectal, resp. rate (!) 32, height 5' 3.75" (1.619 m), weight 91.5 kg, SpO2 92 %.     Body mass index is 34.9 kg/m.   Vent Mode: PRVC FiO2 (%):  [80 %-100 %] 80 % Set Rate:  [30 bmp-32 bmp] 32 bmp Vt Set:  [420 mL] 420 mL PEEP:  [20 cmH20] 20 cmH20 Plateau Pressure:  [35 cmH20-42 cmH20] 38 cmH20   Intake/Output Summary (Last 24 hours) at 06/21/2020 4098 Last data filed at 06/21/2020 0600 Gross per 24 hour  Intake 2427.18 ml  Output 2010 ml  Net 417.18 ml   Filed Weights   06/20/20 0500 06/20/20 1100 06/21/20 0420  Weight: 91.9 kg 91.9 kg 91.5 kg   Physical Exam: General: Adult female laying proned, sedated HENT: [place], AT, ETT in place Respiratory: Occasional expiratory wheeze, improved breath sounds compared to yesterday Cardiovascular: RRR, -M/R/G, no JVD GI: BS+, soft, nontender Extremities: Mottled appearance of bilateral feet with dusky LE digits. Dopplers with pulses present Neuro: Sedated GU: Foley in place  Resolved Hospital Problem list     Assessment & Plan:   Acute hypoxemic respiratory failure/ARDS secondary to COVID-19 pneumonia Sepsis secondary to COVID-19  Mechanical ventilation with lung protective strategy: Pplat 36 today Proning protocol for P:F <150 VAP prevention protocol Goal euvolemia:  Hold lasix today Remdesivir, steroids, baricitinib  Potentially a candidate for ECMO should she be intubated, see separate ECMO consult note. Continue to trend inflammatory markers  Complete 5 day course of CAP coverage 8/29 PAD protocol for RASS goal -4: Propofol and Fentanyl  Asthma Continue on IV steroids as above Pulmicort BID Duonebs q4h  AKI Monitor UOP/Cr Avoid nephrotoxic agents Hold diureses  today  Anxiety Sedation as noted above  COVID Toes LE Dopplers to rule out DVT  Best practice:  Diet: TF Pain/Anxiety/Delirium protocol (if indicated): PAD protocol VAP protocol (if indicated): Yes DVT prophylaxis: Lovenox GI prophylaxis: H2 blocker Glucose control: Covid glycemic protocol Mobility: As tolerated Code Status: Full code Family Communication: Will update family Disposition: ICU  Labs   CBC: Recent Labs  Lab 06/17/20 0905 06/17/20 0905 06/18/20 3614 06/18/20 4315 06/19/20 0723 06/19/20 0723 06/20/20 0404 06/20/20 0628 06/20/20 1012 06/20/20 1708 06/21/20 0402  WBC 8.8  --  13.6*  --  15.2*  --  17.5*  --   --   --  13.6*  NEUTROABS 8.0*  --  11.8*  --  13.2*  --  15.1*  --   --   --  10.9*  HGB 14.1   < > 12.8   < > 12.4   < > 13.2 13.3 13.3 14.3 10.7*  HCT 44.7   < > 40.4   < > 40.9   < > 45.6 39.0 39.0 42.0 37.8  MCV 89.2  --  88.4  --  89.3  --  94.0  --   --   --  100.0  PLT 196  --  208  --  216  --  131*  --   --   --  149*   < > = values in this interval not displayed.    Basic Metabolic Panel: Recent Labs  Lab 06/17/20 0905 06/17/20 0905 06/18/20 4008 06/18/20 6761 06/19/20 0723 06/19/20 1001 06/19/20 1001 06/20/20 0404 06/20/20 0628 06/20/20 1012 06/20/20 1137 06/20/20 1708 06/20/20 2000 06/21/20 0557  NA 139   < > 144   < >  --  144   < > 146* 148* 149*  --  148*  --  147*  K 4.2   < > 4.0   < >  --  4.4   < > 4.7 4.5 4.3  --  4.7  --  4.5  CL 106  --  111  --   --  110  --  112*  --   --   --   --   --  111  CO2 22  --  23  --   --  26  --  23  --   --   --   --   --  27  GLUCOSE 259*  --  155*  --   --  177*  --  191*  --   --   --   --   --  388*  BUN 13  --  12  --   --  19  --  31*  --   --   --   --   --  42*  CREATININE 1.06*  --  0.87  --   --  0.84  --  0.89  --   --   --   --   --  1.42*  CALCIUM 8.0*  --  7.6*  --   --  7.8*  --  7.8*  --   --   --   --   --  7.6*  MG 1.6*   < > 1.9   < > 2.3  --   --  2.7*  --    --  2.6*  --  2.8* 3.1*  PHOS 2.0*   < > 2.3*   < > 2.5  --   --  3.0  --   --  4.1  --  5.3* 5.1*   < > = values in this interval not displayed.   GFR: Estimated Creatinine Clearance: 56.3 mL/min (A) (by C-G formula based on SCr of 1.42 mg/dL (H)). Recent Labs  Lab 05/31/2020 2224 06/17/20 0905 06/18/20 4097 06/19/20 0723 06/20/20 0404 06/21/20 0402  PROCALCITON 1.67  --   --   --   --   --   WBC 11.0*   < > 13.6* 15.2* 17.5* 13.6*  LATICACIDVEN 1.0  --   --   --   --   --    < > = values in this interval not displayed.    Liver Function Tests: Recent Labs  Lab 06/17/20 0905 06/18/20 0638 06/19/20 1001 06/20/20 0404 06/21/20 0557  AST 41 36 29 27 16   ALT 33 27 25 20 16   ALKPHOS 46 45 58 79 75  BILITOT 0.5 0.4 0.4 0.2* 0.5  PROT 5.9* 5.6* 5.9* 5.6* 5.3*  ALBUMIN 2.2* 2.1* 2.2* 2.0* 2.0*   No results for input(s): LIPASE, AMYLASE in the last 168 hours. No results for input(s): AMMONIA in the last 168 hours.  ABG    Component Value Date/Time   PHART 7.148 (LL) 06/20/2020 1708   PCO2ART 85.3 (HH) 06/20/2020 1708   PO2ART 85 06/20/2020 1708   HCO3 29.4 (H) 06/20/2020 1708   TCO2 32 06/20/2020 1708   ACIDBASEDEF 2.0 06/20/2020 1708   O2SAT 91.0 06/20/2020 1708     Coagulation Profile: No results for input(s): INR, PROTIME in the last 168 hours.  Cardiac Enzymes: No results for input(s): CKTOTAL, CKMB, CKMBINDEX, TROPONINI in the last 168 hours.  HbA1C: Hgb A1c MFr Bld  Date/Time Value Ref Range Status  06/18/2020 06:38 AM 5.6 4.8 - 5.6 % Final    Comment:    (NOTE) Pre diabetes:          5.7%-6.4%  Diabetes:              >6.4%  Glycemic control for   <7.0% adults with diabetes     CBG: Recent Labs  Lab 06/20/20 1535 06/20/20 1924 06/20/20 2337 06/21/20 0335 06/21/20 0816  GLUCAP 211* 221* 264* 278* 311*   The patient is critically ill with multiple organ systems failure and requires high complexity decision making for assessment and support,  frequent evaluation and titration of therapies, application of advanced monitoring technologies and extensive interpretation of multiple databases.  Independent Critical Care Time: 40 Minutes.   Rodman Pickle, M.D. [facility] Pulmonary/Critical Care Medicine 06/21/2020 9:22 AM   Please see Amion for pager number to reach on-call Pulmonary and Critical Care Team.

## 2020-06-21 NOTE — Progress Notes (Signed)
Pt head turned to the left at this time.

## 2020-06-21 NOTE — Progress Notes (Signed)
eLink Physician-Brief Progress Note Patient Name: Lisa [name] DOB: 1976-09-03 MRN: [id number]   Date of Service  06/21/2020  HPI/Events of Note  Patient is febrile despite receiving Tylenol. Pt with hypercapnia and respiratory acidosis.  eICU Interventions  Cooling blanket ordered.  Respiratory rate increased to 35.        Frederik Pear 06/21/2020, 8:18 PM

## 2020-06-21 NOTE — Progress Notes (Signed)
Pt head turned to the right at this time 

## 2020-06-21 NOTE — Progress Notes (Signed)
PCCM Progress Note  LE [place] - Positive for acute deep vein thrombosis involving the left  posterior tibial veins, and left peroneal veins.   Assessment Left lower extremity DVTs  Plan Start heparin gtt  Rodman Pickle, M.D. [facility] Pulmonary/Critical Care Medicine 06/21/2020 2:39 PM

## 2020-06-21 NOTE — Progress Notes (Signed)
Per Dr. Loanne Drilling, decrease PEEP to 18 based on recent gas.

## 2020-06-21 NOTE — Progress Notes (Signed)
Pt B/L feet are turning dusky and mottled. The Left foot worse than right foot. DP palpable but thready. Md made aware.

## 2020-06-21 NOTE — Procedures (Signed)
Arterial Catheter Insertion Procedure Note  Lisa [name]  [id number]  02-26-1976  Date:06/21/20  Time:9:47 PM    Provider Performing: Wynelle Beckmann    Procedure: Insertion of Arterial Line [phone]) without US guidance  Indication(s) Blood pressure monitoring and/or need for frequent ABGs  Consent Unable to obtain consent due to emergent nature of procedure.  Anesthesia None   Time Out Verified patient identification, verified procedure, site/side was marked, verified correct patient position, special equipment/implants available, medications/allergies/relevant history reviewed, required imaging and test results available.   Sterile Technique Maximal sterile technique including full sterile barrier drape, hand hygiene, sterile gown, sterile gloves, mask, hair covering, sterile ultrasound probe cover (if used).   Procedure Description Area of catheter insertion was cleaned with chlorhexidine and draped in sterile fashion. Without real-time ultrasound guidance an arterial catheter was placed into the left radial artery.  Appropriate arterial tracings confirmed on monitor.     Complications/Tolerance None; patient tolerated the procedure well.   EBL Minimal   Specimen(s) None

## 2020-06-21 NOTE — Progress Notes (Signed)
PCCM Progress Note  Updated husband. He appreciates the phone call.  Rodman Pickle, M.D. [facility] Pulmonary/Critical Care Medicine 06/21/2020 4:25 PM

## 2020-06-21 NOTE — Progress Notes (Signed)
Pt placed in supine position at this time. No complications, VS in normal limits. ETT secured in proper position. commercial tube holder.

## 2020-06-21 NOTE — Progress Notes (Signed)
Lower Ext venous study completed.   See CVProc for preliminary results.   Griffin Basil, RDMS, RVT

## 2020-06-21 NOTE — Progress Notes (Signed)
[place] for Heparin  Indication: DVT  Allergies  Allergen Reactions  . Effexor [Venlafaxine Hcl] Other (See Comments)    Pt becomes suicidal  . Percocet [Oxycodone-Acetaminophen] Nausea And Vomiting  . Vicodin [Hydrocodone-Acetaminophen] Nausea And Vomiting    Patient Measurements: Height: 5' 3.75" (161.9 cm) Weight: 91.5 kg (201 lb 11.5 oz) IBW/kg (Calculated) : 54.13 Heparin Dosing Weight: 74.9kg  Vital Signs: Temp: 101.7 F (38.7 C) (08/30 2300) Temp Source: Rectal (08/30 2000) BP: 113/74 (08/30 2300) Pulse Rate: 121 (08/30 2300)  Labs: Recent Labs    06/19/20 0723 06/19/20 0723 06/19/20 1001 06/20/20 0404 06/20/20 0628 06/21/20 0402 06/21/20 0402 06/21/20 0557 06/21/20 1120 06/21/20 1120 06/21/20 1652 06/21/20 1950 06/21/20 2210  HGB 12.4   < >  --  13.2   < > 10.7*   < >  --  13.6   < > 13.3 13.3  --   HCT 40.9   < >  --  45.6   < > 37.8   < >  --  40.0  --  39.0 39.0  --   PLT 216  --   --  131*  --  149*  --   --   --   --   --   --   --   HEPARINUNFRC  --   --   --   --   --   --   --   --   --   --   --   --  2.02*  CREATININE  --   --  0.84 0.89  --   --   --  1.42*  --   --   --   --   --    < > = values in this interval not displayed.    Estimated Creatinine Clearance: 56.3 mL/min (A) (by C-G formula based on SCr of 1.42 mg/dL (H)).   Medical History: Past Medical History:  Diagnosis Date  . Asthma 06/17/2020  . Eczema   . Fibromyalgia    lyrica-pain in legs and arms-controlled  . Seasonal allergies     Medications:  Infusions:  . sodium chloride Stopped (06/19/20 0215)  . sodium chloride    . dexmedetomidine (PRECEDEX) IV infusion Stopped (06/20/20 0901)  . famotidine (PEPCID) IV 20 mg (06/21/20 2117)  . feeding supplement (VITAL AF 1.2 CAL) 1,000 mL (06/21/20 2215)  . fentaNYL infusion INTRAVENOUS 250 mcg/hr (06/21/20 2000)  . [START ON 06/22/2020] heparin    . norepinephrine (LEVOPHED) Adult  infusion Stopped (06/21/20 0202)  . propofol (DIPRIVAN) infusion 40 mcg/kg/min (06/21/20 2000)  . sodium chloride Stopped (06/20/20 0910)    Assessment: 44yoF admitted for acute respiratory failure secondary to Covid infection. Dopplers were ordered d/t new signs of VTE including D-dimer elevation from 0.85 to >20 and 'Covid toes' on exam. LLE DVT involving multiple veins found on ultrasound.   Hgb 13.6, PLT WNL. Kidney function has worsened since admission, SCr now 1.4. No signs of bleeding charted.  8/30 PM update:  Heparin level is elevated. Drawn appropriately-lab from PICC and heparin running peripherally. No issues per RN.   Goal of Therapy:  Heparin level 0.3-0.7 units/ml Monitor platelets by anticoagulation protocol: Yes   Plan:  Hold heparin x 1 hr Re-start heparin drip at 900 units/hr at 0030 on 8/31 0900 heparin level Continue to monitor H&H and platelets, signs of bleeding  Narda Bonds, PharmD, BCPS Clinical Pharmacist Phone: [phone]

## 2020-06-21 NOTE — Progress Notes (Signed)
ANTICOAGULATION CONSULT NOTE - Initial Consult  Pharmacy Consult for heparin  Indication: DVT  Allergies  Allergen Reactions  . Effexor [Venlafaxine Hcl] Other (See Comments)    Pt becomes suicidal  . Percocet [Oxycodone-Acetaminophen] Nausea And Vomiting  . Vicodin [Hydrocodone-Acetaminophen] Nausea And Vomiting    Patient Measurements: Height: 5' 3.75" (161.9 cm) Weight: 91.5 kg (201 lb 11.5 oz) IBW/kg (Calculated) : 54.13 Heparin Dosing Weight: 74.9kg  Vital Signs: Temp: 100.9 F (38.3 C) (08/30 1141) Temp Source: Axillary (08/30 1141) BP: 107/66 (08/30 1300) Pulse Rate: 125 (08/30 1300)  Labs: Recent Labs    06/19/20 0723 06/19/20 0723 06/19/20 1001 06/20/20 0404 06/20/20 0628 06/20/20 1708 06/20/20 1708 06/21/20 0402 06/21/20 0557 06/21/20 1120  HGB 12.4   < >  --  13.2   < > 14.3   < > 10.7*  --  13.6  HCT 40.9   < >  --  45.6   < > 42.0  --  37.8  --  40.0  PLT 216  --   --  131*  --   --   --  149*  --   --   CREATININE  --   --  0.84 0.89  --   --   --   --  1.42*  --    < > = values in this interval not displayed.    Estimated Creatinine Clearance: 56.3 mL/min (A) (by C-G formula based on SCr of 1.42 mg/dL (H)).   Medical History: Past Medical History:  Diagnosis Date  . Asthma 06/17/2020  . Eczema   . Fibromyalgia    lyrica-pain in legs and arms-controlled  . Seasonal allergies     Medications:  Infusions:  . sodium chloride Stopped (06/19/20 0215)  . dexmedetomidine (PRECEDEX) IV infusion Stopped (06/20/20 0901)  . famotidine (PEPCID) IV Stopped (06/21/20 1101)  . feeding supplement (VITAL AF 1.2 CAL) 40 mL/hr at 06/21/20 0800  . fentaNYL infusion INTRAVENOUS 250 mcg/hr (06/21/20 1300)  . norepinephrine (LEVOPHED) Adult infusion Stopped (06/21/20 0202)  . propofol (DIPRIVAN) infusion 40 mcg/kg/min (06/21/20 1426)  . sodium chloride Stopped (06/20/20 0910)    Assessment: 44yoF admitted for acute respiratory failure secondary to Covid  infection. Dopplers were ordered d/t new signs of VTE including D-dimer elevation from 0.85 to >20 and 'Covid toes' on exam. LLE DVT involving multiple veins found on ultrasound.   Hgb 13.6, PLT WNL. Kidney function has worsened since admission, SCr now 1.4. No signs of bleeding charted.   Goal of Therapy:  Heparin level 0.3-0.7 units/ml Monitor platelets by anticoagulation protocol: Yes   Plan:  D/c lovenox  Give heparin 4,450 units bolus x 1 Start heparin infusion at 1,100 units/hr Check anti-Xa level in 6 hours and daily while on heparin Continue to monitor H&H and platelets, signs of bleeding  Mercy Riding, PharmD PGY1 Acute Care Pharmacy Resident Please refer to [facility] for unit-specific pharmacist

## 2020-06-22 LAB — POCT I-STAT 7, (LYTES, BLD GAS, ICA,H+H)
Acid-base deficit: 9 mmol/L — ABNORMAL HIGH (ref 0.0–2.0)
Acid-base deficit: 5 mmol/L — ABNORMAL HIGH (ref 0.0–2.0)
Acid-base deficit: 5 mmol/L — ABNORMAL HIGH (ref 0.0–2.0)
Bicarbonate: 23.2 mmol/L (ref 20.0–28.0)
Bicarbonate: 25.1 mmol/L (ref 20.0–28.0)
Bicarbonate: 25.5 mmol/L (ref 20.0–28.0)
Calcium, Ion: 1.12 mmol/L — ABNORMAL LOW (ref 1.15–1.40)
Calcium, Ion: 1.07 mmol/L — ABNORMAL LOW (ref 1.15–1.40)
Calcium, Ion: 1.06 mmol/L — ABNORMAL LOW (ref 1.15–1.40)
HCT: 38 % (ref 36.0–46.0)
HCT: 34 % — ABNORMAL LOW (ref 36.0–46.0)
HCT: 34 % — ABNORMAL LOW (ref 36.0–46.0)
Hemoglobin: 12.9 g/dL (ref 12.0–15.0)
Hemoglobin: 11.6 g/dL — ABNORMAL LOW (ref 12.0–15.0)
Hemoglobin: 11.6 g/dL — ABNORMAL LOW (ref 12.0–15.0)
O2 Saturation: 89 %
O2 Saturation: 93 %
O2 Saturation: 95 %
Potassium: 4.1 mmol/L (ref 3.5–5.1)
Potassium: 3.6 mmol/L (ref 3.5–5.1)
Potassium: 3.6 mmol/L (ref 3.5–5.1)
Sodium: 141 mmol/L (ref 135–145)
Sodium: 142 mmol/L (ref 135–145)
Sodium: 142 mmol/L (ref 135–145)
TCO2: 26 mmol/L (ref 22–32)
TCO2: 27 mmol/L (ref 22–32)
TCO2: 28 mmol/L (ref 22–32)
pCO2 arterial: 88.5 mmHg (ref 32.0–48.0)
pCO2 arterial: 73.8 mmHg (ref 32.0–48.0)
pCO2 arterial: 78.5 mmHg (ref 32.0–48.0)
pH, Arterial: 7.026 — CL (ref 7.350–7.450)
pH, Arterial: 7.139 — CL (ref 7.350–7.450)
pH, Arterial: 7.12 — CL (ref 7.350–7.450)
pO2, Arterial: 86 mmHg (ref 83.0–108.0)
pO2, Arterial: 88 mmHg (ref 83.0–108.0)
pO2, Arterial: 104 mmHg (ref 83.0–108.0)

## 2020-06-22 LAB — CBC WITH DIFFERENTIAL/PLATELET
Abs Immature Granulocytes: 2.5 10*3/uL — ABNORMAL HIGH (ref 0.00–0.07)
Band Neutrophils: 1 %
Basophils Absolute: 0 10*3/uL (ref 0.0–0.1)
Basophils Relative: 0 %
Eosinophils Absolute: 0 10*3/uL (ref 0.0–0.5)
Eosinophils Relative: 0 %
HCT: 42.9 % (ref 36.0–46.0)
Hemoglobin: 12.3 g/dL (ref 12.0–15.0)
Lymphocytes Relative: 4 %
Lymphs Abs: 0.7 10*3/uL (ref 0.7–4.0)
MCH: 28.3 pg (ref 26.0–34.0)
MCHC: 28.7 g/dL — ABNORMAL LOW (ref 30.0–36.0)
MCV: 98.6 fL (ref 80.0–100.0)
Metamyelocytes Relative: 5 %
Monocytes Absolute: 0.2 10*3/uL (ref 0.1–1.0)
Monocytes Relative: 1 %
Myelocytes: 10 %
Neutro Abs: 13.1 10*3/uL — ABNORMAL HIGH (ref 1.7–7.7)
Neutrophils Relative %: 79 %
Platelets: 232 10*3/uL (ref 150–400)
RBC: 4.35 MIL/uL (ref 3.87–5.11)
RDW: 14.8 % (ref 11.5–15.5)
WBC: 16.4 10*3/uL — ABNORMAL HIGH (ref 4.0–10.5)
nRBC: 0.9 % — ABNORMAL HIGH (ref 0.0–0.2)
nRBC: 2 /100 WBC — ABNORMAL HIGH

## 2020-06-22 LAB — CULTURE, BLOOD (ROUTINE X 2)
Culture: NO GROWTH
Culture: NO GROWTH
Special Requests: ADEQUATE
Special Requests: ADEQUATE

## 2020-06-22 LAB — GLUCOSE, CAPILLARY
Glucose-Capillary: 414 mg/dL — ABNORMAL HIGH (ref 70–99)
Glucose-Capillary: 490 mg/dL — ABNORMAL HIGH (ref 70–99)
Glucose-Capillary: 448 mg/dL — ABNORMAL HIGH (ref 70–99)
Glucose-Capillary: 479 mg/dL — ABNORMAL HIGH (ref 70–99)
Glucose-Capillary: 474 mg/dL — ABNORMAL HIGH (ref 70–99)
Glucose-Capillary: 430 mg/dL — ABNORMAL HIGH (ref 70–99)
Glucose-Capillary: 435 mg/dL — ABNORMAL HIGH (ref 70–99)
Glucose-Capillary: 431 mg/dL — ABNORMAL HIGH (ref 70–99)
Glucose-Capillary: 353 mg/dL — ABNORMAL HIGH (ref 70–99)
Glucose-Capillary: 479 mg/dL — ABNORMAL HIGH (ref 70–99)
Glucose-Capillary: 401 mg/dL — ABNORMAL HIGH (ref 70–99)
Glucose-Capillary: 455 mg/dL — ABNORMAL HIGH (ref 70–99)
Glucose-Capillary: 392 mg/dL — ABNORMAL HIGH (ref 70–99)
Glucose-Capillary: 418 mg/dL — ABNORMAL HIGH (ref 70–99)
Glucose-Capillary: 365 mg/dL — ABNORMAL HIGH (ref 70–99)

## 2020-06-22 LAB — COMPREHENSIVE METABOLIC PANEL
ALT: 16 U/L (ref 0–44)
AST: 28 U/L (ref 15–41)
Albumin: 2 g/dL — ABNORMAL LOW (ref 3.5–5.0)
Alkaline Phosphatase: 58 U/L (ref 38–126)
Anion gap: 11 (ref 5–15)
BUN: 63 mg/dL — ABNORMAL HIGH (ref 6–20)
CO2: 23 mmol/L (ref 22–32)
Calcium: 7.5 mg/dL — ABNORMAL LOW (ref 8.9–10.3)
Chloride: 109 mmol/L (ref 98–111)
Creatinine, Ser: 2.45 mg/dL — ABNORMAL HIGH (ref 0.44–1.00)
GFR calc Af Amer: 27 mL/min — ABNORMAL LOW (ref >60)
GFR calc non Af Amer: 23 mL/min — ABNORMAL LOW (ref >60)
Glucose, Bld: 460 mg/dL — ABNORMAL HIGH (ref 70–99)
Potassium: 4.6 mmol/L (ref 3.5–5.1)
Sodium: 143 mmol/L (ref 135–145)
Total Bilirubin: 0.3 mg/dL (ref 0.3–1.2)
Total Protein: 4.9 g/dL — ABNORMAL LOW (ref 6.5–8.1)

## 2020-06-22 LAB — C-REACTIVE PROTEIN: CRP: 5.2 mg/dL — ABNORMAL HIGH (ref <1.0)

## 2020-06-22 LAB — SEDIMENTATION RATE: Sed Rate: 10 mm/hr (ref 0–22)

## 2020-06-22 LAB — PHOSPHORUS: Phosphorus: 5.8 mg/dL — ABNORMAL HIGH (ref 2.5–4.6)

## 2020-06-22 LAB — HEPARIN LEVEL (UNFRACTIONATED): Heparin Unfractionated: >2.2 IU/mL — ABNORMAL HIGH (ref 0.30–0.70)

## 2020-06-22 LAB — TRIGLYCERIDES: Triglycerides: 563 mg/dL — ABNORMAL HIGH (ref <150)

## 2020-06-22 LAB — FERRITIN: Ferritin: 674 ng/mL — ABNORMAL HIGH (ref 11–307)

## 2020-06-22 LAB — D-DIMER, QUANTITATIVE: D-Dimer, Quant: >20 ug/mL-FEU — ABNORMAL HIGH (ref 0.00–0.50)

## 2020-06-22 LAB — FIBRINOGEN: Fibrinogen: 214 mg/dL (ref 210–475)

## 2020-06-22 LAB — LACTATE DEHYDROGENASE: LDH: 673 U/L — ABNORMAL HIGH (ref 98–192)

## 2020-06-22 MED ORDER — SODIUM CHLORIDE 0.9 % IV SOLN
0.0000 ug/kg/min | INTRAVENOUS | Status: DC
  Administered 2020-06-22: 3 ug/kg/min via INTRAVENOUS
  Administered 2020-06-24 – 2020-06-25 (×3): 2 ug/kg/min via INTRAVENOUS
  Filled 2020-06-22 (×5): qty 20

## 2020-06-22 MED ORDER — ARTIFICIAL TEARS OPHTHALMIC OINT
1.0000 "application " | TOPICAL_OINTMENT | Freq: Three times a day (TID) | OPHTHALMIC | Status: DC
  Administered 2020-06-22 – 2020-06-25 (×10): 1 via OPHTHALMIC
  Filled 2020-06-22 (×3): qty 3.5

## 2020-06-22 MED ORDER — BARICITINIB 1 MG PO TABS
1.0000 mg | ORAL_TABLET | Freq: Every day | ORAL | Status: DC
  Administered 2020-06-23: 1 mg
  Filled 2020-06-22 (×2): qty 1

## 2020-06-22 MED ORDER — MIDAZOLAM BOLUS VIA INFUSION
1.0000 mg | INTRAVENOUS | Status: DC | PRN
  Filled 2020-06-22: qty 2

## 2020-06-22 MED ORDER — IPRATROPIUM-ALBUTEROL 0.5-2.5 (3) MG/3ML IN SOLN: 3.0000 mL | RESPIRATORY_TRACT | Status: DC | PRN

## 2020-06-22 MED ORDER — SODIUM BICARBONATE 8.4 % IV SOLN
100.0000 meq | Freq: Once | INTRAVENOUS | Status: AC
  Administered 2020-06-22: 100 meq via INTRAVENOUS
  Filled 2020-06-22: qty 50

## 2020-06-22 MED ORDER — MIDAZOLAM 50MG/50ML (1MG/ML) PREMIX INFUSION
0.0000 mg/h | INTRAVENOUS | Status: DC
  Administered 2020-06-22 – 2020-06-23 (×2): 2 mg/h via INTRAVENOUS
  Administered 2020-06-23: 6 mg/h via INTRAVENOUS
  Administered 2020-06-24 – 2020-06-25 (×2): 2 mg/h via INTRAVENOUS
  Filled 2020-06-22 (×5): qty 50

## 2020-06-22 MED ORDER — DEXTROSE 50 % IV SOLN: 0.0000 mL | INTRAVENOUS | Status: DC | PRN

## 2020-06-22 MED ORDER — INSULIN ASPART 100 UNIT/ML ~~LOC~~ SOLN
6.0000 [IU] | SUBCUTANEOUS | Status: DC
  Administered 2020-06-22 (×2): 6 [IU] via SUBCUTANEOUS

## 2020-06-22 MED ORDER — INSULIN ASPART 100 UNIT/ML ~~LOC~~ SOLN
0.0000 [IU] | SUBCUTANEOUS | Status: DC
  Administered 2020-06-22 (×2): 15 [IU] via SUBCUTANEOUS

## 2020-06-22 MED ORDER — CISATRACURIUM BOLUS VIA INFUSION
10.0000 mg | Freq: Once | INTRAVENOUS | Status: AC
  Administered 2020-06-22: 10 mg via INTRAVENOUS
  Filled 2020-06-22: qty 10

## 2020-06-22 MED ORDER — INSULIN ASPART 100 UNIT/ML ~~LOC~~ SOLN
0.0000 [IU] | SUBCUTANEOUS | Status: DC
  Administered 2020-06-22: 20 [IU] via SUBCUTANEOUS

## 2020-06-22 MED ORDER — HEPARIN (PORCINE) 25000 UT/250ML-% IV SOLN
550.0000 [IU]/h | INTRAVENOUS | Status: DC
  Administered 2020-06-22: 650 [IU]/h via INTRAVENOUS
  Filled 2020-06-22: qty 250

## 2020-06-22 MED ORDER — INSULIN GLARGINE 100 UNIT/ML ~~LOC~~ SOLN
10.0000 [IU] | Freq: Two times a day (BID) | SUBCUTANEOUS | Status: DC
  Administered 2020-06-22: 10 [IU] via SUBCUTANEOUS
  Filled 2020-06-22 (×2): qty 0.1

## 2020-06-22 MED ORDER — INSULIN REGULAR(HUMAN) IN NACL 100-0.9 UT/100ML-% IV SOLN
INTRAVENOUS | Status: DC
  Administered 2020-06-22: 21 [IU]/h via INTRAVENOUS
  Administered 2020-06-22: 6.5 [IU]/h via INTRAVENOUS
  Administered 2020-06-23: 13 [IU]/h via INTRAVENOUS
  Administered 2020-06-23: 23 [IU]/h via INTRAVENOUS
  Filled 2020-06-22 (×4): qty 100

## 2020-06-22 NOTE — Progress Notes (Signed)
Pt placed in prone position at this time. No apparent complications. VS are within normal limits. ETT secured in proper position with cloth tape. Pt's head turned to the right.

## 2020-06-22 NOTE — Progress Notes (Signed)
NAME:  Lisa [name], MRN:  [id number], DOB:  [birth year], LOS: 5 ADMISSION DATE:  05/26/2020, CONSULTATION DATE: 06/17/2020 REFERRING MD: Dr. Aleene Davidson, CHIEF COMPLAINT: COVID-19 pneumonia  Brief History   44 year old female, no significant past medical history admitted for bilateral pneumonia, COVID-19 positive.  Pulmonary critical care consulted for evaluation as she is on 100% heated high flow 40 L.  History of present illness   This is a 44 year old female with no significant past medical history except for asthma, BMI 30, recent sick contact, daughter testing positive for COVID-19.  Patient was diagnosed on Monday symptoms started last weekend after known sick contact with patient's daughter who tested positive she was found to be hypoxic in the emergency department.  Initial labs revealed elevated white count of 11,000, initial CRP of 22, D-dimer of 0.85, glucose 171, serum creatinine of 1.  Patient has had progressive hypoxia in the ER today.  Had escalating delivery mechanisms for oxygen.  At this point on heated high flow nasal cannula at 100% and 40 L.  She feels comfortable breathing on this.  She does not feel labored.  She is able to communicate in full sentences.  However O2 sats dropped into the high 70s.  If she rests comfortably her O2 sats maintained at about 88%.  We discussed the utility of self proning.  Patient was self proned in the emergency department on heated high flow and her O2 sats came up to 93%.  Past Medical History   Past Medical History:  Diagnosis Date  . Asthma 06/17/2020  . Eczema   . Fibromyalgia    lyrica-pain in legs and arms-controlled  . Seasonal allergies      Significant Hospital Events   ICU admission  Consults:  Pulmonary critical care  Procedures:  ETT 8/29  Significant Diagnostic Tests:   8/30 LE Doppler: RIGHT:  - There is no evidence of deep vein thrombosis in the lower extremity.    - No cystic structure found in the popliteal  fossa.    LEFT:  - Findings consistent with acute deep vein thrombosis involving the left  posterior tibial veins, and left peroneal veins.  - No cystic structure found in the popliteal fossa.     Micro Data:  COVID-19 PCR: Positive  Antimicrobials:  remdesivir 8/25-8/30 baricitinib 8/26- Interim history/subjective:  8/31:No further paralytics required at this time. Remains proned on 70%. New le dvt noted and started on heparin gtt overnight  Objective   Blood pressure 98/64, pulse (!) 121, temperature (!) 101 F (38.3 C), temperature source Rectal, resp. rate (!) 35, height 5' 3.75" (1.619 m), weight 94.1 kg, SpO2 96 %.     Body mass index is 35.89 kg/m.   Vent Mode: PRVC FiO2 (%):  [70 %-80 %] 70 % Set Rate:  [32 bmp-35 bmp] 35 bmp Vt Set:  [420 mL] 420 mL PEEP:  [18 cmH20-20 cmH20] 18 cmH20 Plateau Pressure:  [30 cmH20-35 cmH20] 30 cmH20   Intake/Output Summary (Last 24 hours) at 06/22/2020 0809 Last data filed at 06/22/2020 0600 Gross per 24 hour  Intake 1846.09 ml  Output 995 ml  Net 851.09 ml   Filed Weights   06/20/20 1100 06/21/20 0420 06/22/20 0500  Weight: 91.9 kg 91.5 kg 94.1 kg   Physical Exam: General: Adult female laying proned, sedated HENT: [place], AT, ETT in place Respiratory: Occasional expiratory wheeze, improved breath sounds compared to yesterday Cardiovascular: RRR, -M/R/G, no JVD GI: BS+, soft, nontender Extremities: Mottled appearance of bilateral  feet with dusky LE digits.diffuse anasarca, Dopplers with pulses present Neuro: Sedated GU: Foley in place  Resolved Hospital Problem list     Assessment & Plan:   Acute hypoxemic respiratory failure/ARDS secondary to COVID-19 pneumonia Sepsis secondary to COVID-19  Mechanical ventilation with lung protective strategy: Pplat 36 today Proning protocol for P:F <150 VAP prevention protocol Goal euvolemia: Hold lasix today Remdesivir completed, steroids, baricitinib  Continue to trend  inflammatory markers (increasing today)  Complete 5 day course of CAP coverage 8/29 PAD protocol for RASS goal -4: Propofol and Fentanyl (TG up so will need to likely change sedation)  Asthma  Continue on IV steroids as above Pulmicort BID Duonebs q4h  AKI Monitor UOP/Cr Avoid nephrotoxic agents Hold diuresis today again with rising indices  Anxiety Sedation as noted above  LLE DVT: -heparin gtt  Hyperglycemia:  -monitor  Best practice:  Diet: TF Pain/Anxiety/Delirium protocol (if indicated): PAD protocol VAP protocol (if indicated): Yes DVT prophylaxis: Lovenox GI prophylaxis: H2 blocker Glucose control: adding lantus and ssi Mobility: As tolerated Code Status: Full code Family Communication: pending Disposition: ICU  Labs   CBC: Recent Labs  Lab 06/18/20 0638 06/18/20 8185 06/19/20 0723 06/19/20 0723 06/20/20 0404 06/20/20 0628 06/21/20 0402 06/21/20 1120 06/21/20 1652 06/21/20 1950 06/22/20 0309  WBC 13.6*  --  15.2*  --  17.5*  --  13.6*  --   --   --  16.4*  NEUTROABS 11.8*  --  13.2*  --  15.1*  --  10.9*  --   --   --  13.1*  HGB 12.8   < > 12.4   < > 13.2   < > 10.7* 13.6 13.3 13.3 12.3  HCT 40.4   < > 40.9   < > 45.6   < > 37.8 40.0 39.0 39.0 42.9  MCV 88.4  --  89.3  --  94.0  --  100.0  --   --   --  98.6  PLT 208  --  216  --  131*  --  149*  --   --   --  232   < > = values in this interval not displayed.    Basic Metabolic Panel: Recent Labs  Lab 06/18/20 0638 06/19/20 0723 06/19/20 1001 06/20/20 0404 06/20/20 0628 06/20/20 1137 06/20/20 1708 06/20/20 2000 06/21/20 0557 06/21/20 1120 06/21/20 1652 06/21/20 1709 06/21/20 1950 06/22/20 0309  NA 144   < > 144 146*   < >  --    < >  --  147* 149* 144  --  145 143  K 4.0   < > 4.4 4.7   < >  --    < >  --  4.5 4.5 4.8  --  4.8 4.6  CL 111  --  110 112*  --   --   --   --  111  --   --   --   --  109  CO2 23  --  26 23  --   --   --   --  27  --   --   --   --  23  GLUCOSE 155*   --  177* 191*  --   --   --   --  388*  --   --   --   --  460*  BUN 12  --  19 31*  --   --   --   --  42*  --   --   --   --  63*  CREATININE 0.87  --  0.84 0.89  --   --   --   --  1.42*  --   --   --   --  2.45*  CALCIUM 7.6*  --  7.8* 7.8*  --   --   --   --  7.6*  --   --   --   --  7.5*  MG 1.9   < >  --  2.7*  --  2.6*  --  2.8* 3.1*  --   --  3.1*  --   --   PHOS 2.3*   < >  --  3.0   < > 4.1  --  5.3* 5.1*  --   --  4.8*  --  5.8*   < > = values in this interval not displayed.   GFR: Estimated Creatinine Clearance: 33.1 mL/min (A) (by C-G formula based on SCr of 2.45 mg/dL (H)). Recent Labs  Lab 06/09/2020 2224 06/17/20 0905 06/19/20 0723 06/20/20 0404 06/21/20 0402 06/22/20 0309  PROCALCITON 1.67  --   --   --   --   --   WBC 11.0*   < > 15.2* 17.5* 13.6* 16.4*  LATICACIDVEN 1.0  --   --   --   --   --    < > = values in this interval not displayed.    Liver Function Tests: Recent Labs  Lab 06/18/20 0638 06/19/20 1001 06/20/20 0404 06/21/20 0557 06/22/20 0309  AST 36 29 27 16 28   ALT 27 25 20 16 16   ALKPHOS 45 58 79 75 58  BILITOT 0.4 0.4 0.2* 0.5 0.3  PROT 5.6* 5.9* 5.6* 5.3* 4.9*  ALBUMIN 2.1* 2.2* 2.0* 2.0* 2.0*   No results for input(s): LIPASE, AMYLASE in the last 168 hours. No results for input(s): AMMONIA in the last 168 hours.  ABG    Component Value Date/Time   PHART 7.102 (LL) 06/21/2020 1950   PCO2ART 88.7 (HH) 06/21/2020 1950   PO2ART 108 06/21/2020 1950   HCO3 26.8 06/21/2020 1950   TCO2 29 06/21/2020 1950   ACIDBASEDEF 4.0 (H) 06/21/2020 1950   O2SAT 94.0 06/21/2020 1950     Coagulation Profile: No results for input(s): INR, PROTIME in the last 168 hours.  Cardiac Enzymes: No results for input(s): CKTOTAL, CKMB, CKMBINDEX, TROPONINI in the last 168 hours.  HbA1C: Hgb A1c MFr Bld  Date/Time Value Ref Range Status  06/18/2020 06:38 AM 5.6 4.8 - 5.6 % Final    Comment:    (NOTE) Pre diabetes:          5.7%-6.4%  Diabetes:               >6.4%  Glycemic control for   <7.0% adults with diabetes     CBG: Recent Labs  Lab 06/21/20 1138 06/21/20 1516 06/21/20 2009 06/21/20 2347 06/22/20 0344  GLUCAP 335* 330* 343* 356* 414*   Critical care time: The patient is critically ill with multiple organ systems failure and requires high complexity decision making for assessment and support, frequent evaluation and titration of therapies, application of advanced monitoring technologies and extensive interpretation of multiple databases.  Critical care time 42 mins. This represents my time independent of the NPs time taking care of the pt. This is excluding procedures.    [facility] 06/22/2020, 8:09 AM

## 2020-06-22 NOTE — Progress Notes (Signed)
Worsening acidosis despite optimizing rr and 8cc/kg TV. Will give 2 amp bicarb (despite bicarb normal) to temporize and start paralysis to see if able to get any benefit. Will obtain abg 1-2 hours after paralyzed.

## 2020-06-22 NOTE — Progress Notes (Addendum)
Inpatient Diabetes Program Recommendations  AACE/ADA: New Consensus Statement on Inpatient Glycemic Control (2015)  Target Ranges:  Prepandial:   less than 140 mg/dL      Peak postprandial:   less than 180 mg/dL (1-2 hours)      Critically ill patients:  140 - 180 mg/dL   Lab Results  Component Value Date   GLUCAP 490 (H) 06/22/2020   HGBA1C 5.6 06/18/2020    Review of Glycemic Control Results for Lisa [name], Lisa [name] (MRN [id number]) as of 06/22/2020 11:34  Ref. Range 06/21/2020 15:16 06/21/2020 20:09 06/21/2020 23:47 06/22/2020 03:44 06/22/2020 08:19  Glucose-Capillary Latest Ref Range: 70 - 99 mg/dL 330 (H) 343 (H) 356 (H) 414 (H) 490 (H)   Diabetes history: None Outpatient Diabetes medications:  Current orders for Inpatient glycemic control:  Novolog 0-15 units q 4 hours, Lantus 10 units bid, Novolog 6 units q 4 hours Vital 45 ml/ hr  Inpatient Diabetes Program Recommendations:    Note that blood sugar>400 mg/dL.  Recommend IV insulin, as SQ insulin may not be absorbing at this time.   Thanks,  Adah Perl, RN, BC-ADM Inpatient Diabetes Coordinator Pager [phone] (8a-5p)

## 2020-06-22 NOTE — Progress Notes (Signed)
Updated husband on pt's worseninf renal failure and acidosis.   Explained we will attempt paralysis to see if can benefit and may reprone earlier.   Will repeat abg after paralysis and go from there.

## 2020-06-22 NOTE — Progress Notes (Signed)
Pt placed in supine position at this time. No apparent complications. VS are within normal limits. ETT secured in proper position with commercial tube holder.

## 2020-06-22 NOTE — Progress Notes (Signed)
eLink Physician-Brief Progress Note Patient Name: MARQUISHA NIKOLOV DOB: [birth year] MRN: [id number]   Date of Service  06/22/2020  HPI/Events of Note  Patient with sub-optimal glycemic control.  eICU Interventions  Patient migrated to a tighter SSI coverage schedule.        Kerry Kass Theodore Virgin 06/22/2020, 4:48 AM

## 2020-06-22 NOTE — Progress Notes (Signed)
[place] for Heparin  Indication: DVT  Allergies  Allergen Reactions  . Effexor [Venlafaxine Hcl] Other (See Comments)    Pt becomes suicidal  . Percocet [Oxycodone-Acetaminophen] Nausea And Vomiting  . Vicodin [Hydrocodone-Acetaminophen] Nausea And Vomiting    Patient Measurements: Height: 5' 3.75" (161.9 cm) Weight: 94.1 kg (207 lb 7.3 oz) IBW/kg (Calculated) : 54.13 Heparin Dosing Weight: 74.9kg  Vital Signs: Temp: 100.2 F (37.9 C) (08/31 0700) Temp Source: Rectal (08/31 0530) BP: 103/68 (08/31 0800) Pulse Rate: 128 (08/31 0915)  Labs: Recent Labs    06/20/20 0404 06/20/20 9211 06/21/20 0402 06/21/20 0557 06/21/20 1120 06/21/20 1950 06/21/20 1950 06/21/20 2210 06/22/20 0309 06/22/20 1043  HGB 13.2   < > 10.7*  --    < > 13.3   < >  --  12.3 12.9  HCT 45.6   < > 37.8  --    < > 39.0  --   --  42.9 38.0  PLT 131*  --  149*  --   --   --   --   --  232  --   HEPARINUNFRC  --   --   --   --   --   --   --  2.02*  --   --   CREATININE 0.89  --   --  1.42*  --   --   --   --  2.45*  --    < > = values in this interval not displayed.    Estimated Creatinine Clearance: 33.1 mL/min (A) (by C-G formula based on SCr of 2.45 mg/dL (H)).   Medical History: Past Medical History:  Diagnosis Date  . Asthma 06/17/2020  . Eczema   . Fibromyalgia    lyrica-pain in legs and arms-controlled  . Seasonal allergies     Medications:  Infusions:  . sodium chloride Stopped (06/19/20 0215)  . sodium chloride    . cisatracurium (NIMBEX) infusion    . famotidine (PEPCID) IV 20 mg (06/22/20 1059)  . feeding supplement (VITAL AF 1.2 CAL) 1,000 mL (06/21/20 2215)  . fentaNYL infusion INTRAVENOUS 250 mcg/hr (06/22/20 0900)  . heparin 900 Units/hr (06/22/20 0042)  . midazolam    . norepinephrine (LEVOPHED) Adult infusion Stopped (06/21/20 0202)  . propofol (DIPRIVAN) infusion 40 mcg/kg/min (06/22/20 0925)  . sodium chloride Stopped  (06/20/20 0910)    Assessment: 44yoF admitted for acute respiratory failure secondary to Covid infection. Dopplers were ordered d/t new signs of VTE including D-dimer elevation from 0.85 to >20 and 'Covid toes' on exam. LLE DVT involving multiple veins found on ultrasound.   Hgb 12-13. PLT WNL. Kidney function has worsened since admission, SCr increased to 2.45. No signs of bleeding charted.  Heparin level remains elevated, now >2.20. Verified with RN that gtt was appropriately held for an hour and lab was drawn correctly from PICC w/ heparin running peripherally. No issues per RN.  Goal of Therapy:  Heparin level 0.3-0.7 units/ml Monitor platelets by anticoagulation protocol: Yes   Plan:  Hold heparin x 1 hr Re-start heparin drip at 650 units/hr at 1400 on 8/31 2200 heparin level Continue to monitor H&H and platelets, signs of bleeding  Mercy Riding, PharmD PGY1 Acute Care Pharmacy Resident Please refer to [facility] for unit-specific pharmacist

## 2020-06-22 NOTE — Progress Notes (Signed)
eLink Physician-Brief Progress Note Patient Name: Lisa [name] DOB: 11-08-1975 MRN: [id number]   Date of Service  06/22/2020  HPI/Events of Note  Patient is having loose stools, bedside RN requests [place] Interventions  Flexiseal ordered.        Kerry Kass Loral Campi 06/22/2020, 3:22 AM

## 2020-06-23 ENCOUNTER — Inpatient Hospital Stay (HOSPITAL_COMMUNITY): Payer: BC Managed Care – PPO

## 2020-06-23 LAB — RENAL FUNCTION PANEL
Albumin: 2 g/dL — ABNORMAL LOW (ref 3.5–5.0)
Anion gap: 11 (ref 5–15)
BUN: 81 mg/dL — ABNORMAL HIGH (ref 6–20)
CO2: 23 mmol/L (ref 22–32)
Calcium: 7.4 mg/dL — ABNORMAL LOW (ref 8.9–10.3)
Chloride: 107 mmol/L (ref 98–111)
Creatinine, Ser: 3.95 mg/dL — ABNORMAL HIGH (ref 0.44–1.00)
GFR calc Af Amer: 15 mL/min — ABNORMAL LOW (ref >60)
GFR calc non Af Amer: 13 mL/min — ABNORMAL LOW (ref >60)
Glucose, Bld: 145 mg/dL — ABNORMAL HIGH (ref 70–99)
Phosphorus: 5.5 mg/dL — ABNORMAL HIGH (ref 2.5–4.6)
Potassium: 4.2 mmol/L (ref 3.5–5.1)
Sodium: 141 mmol/L (ref 135–145)

## 2020-06-23 LAB — POCT I-STAT 7, (LYTES, BLD GAS, ICA,H+H)
Acid-base deficit: 7 mmol/L — ABNORMAL HIGH (ref 0.0–2.0)
Acid-base deficit: 9 mmol/L — ABNORMAL HIGH (ref 0.0–2.0)
Bicarbonate: 24.3 mmol/L (ref 20.0–28.0)
Bicarbonate: 22.3 mmol/L (ref 20.0–28.0)
Calcium, Ion: 1.1 mmol/L — ABNORMAL LOW (ref 1.15–1.40)
Calcium, Ion: 1.06 mmol/L — ABNORMAL LOW (ref 1.15–1.40)
HCT: 32 % — ABNORMAL LOW (ref 36.0–46.0)
HCT: 30 % — ABNORMAL LOW (ref 36.0–46.0)
Hemoglobin: 10.9 g/dL — ABNORMAL LOW (ref 12.0–15.0)
Hemoglobin: 10.2 g/dL — ABNORMAL LOW (ref 12.0–15.0)
O2 Saturation: 94 %
O2 Saturation: 83 %
Potassium: 4.1 mmol/L (ref 3.5–5.1)
Potassium: 4 mmol/L (ref 3.5–5.1)
Sodium: 140 mmol/L (ref 135–145)
Sodium: 141 mmol/L (ref 135–145)
TCO2: 27 mmol/L (ref 22–32)
TCO2: 25 mmol/L (ref 22–32)
pCO2 arterial: 80.8 mmHg (ref 32.0–48.0)
pCO2 arterial: 77.3 mmHg (ref 32.0–48.0)
pH, Arterial: 7.086 — CL (ref 7.350–7.450)
pH, Arterial: 7.068 — CL (ref 7.350–7.450)
pO2, Arterial: 100 mmHg (ref 83.0–108.0)
pO2, Arterial: 68 mmHg — ABNORMAL LOW (ref 83.0–108.0)

## 2020-06-23 LAB — COMPREHENSIVE METABOLIC PANEL
ALT: 19 U/L (ref 0–44)
AST: 25 U/L (ref 15–41)
Albumin: 1.9 g/dL — ABNORMAL LOW (ref 3.5–5.0)
Alkaline Phosphatase: 70 U/L (ref 38–126)
Anion gap: 13 (ref 5–15)
BUN: 87 mg/dL — ABNORMAL HIGH (ref 6–20)
CO2: 22 mmol/L (ref 22–32)
Calcium: 7.5 mg/dL — ABNORMAL LOW (ref 8.9–10.3)
Chloride: 107 mmol/L (ref 98–111)
Creatinine, Ser: 3.76 mg/dL — ABNORMAL HIGH (ref 0.44–1.00)
GFR calc Af Amer: 16 mL/min — ABNORMAL LOW (ref >60)
GFR calc non Af Amer: 14 mL/min — ABNORMAL LOW (ref >60)
Glucose, Bld: 275 mg/dL — ABNORMAL HIGH (ref 70–99)
Potassium: 3.7 mmol/L (ref 3.5–5.1)
Sodium: 142 mmol/L (ref 135–145)
Total Bilirubin: 0.7 mg/dL (ref 0.3–1.2)
Total Protein: 4.5 g/dL — ABNORMAL LOW (ref 6.5–8.1)

## 2020-06-23 LAB — CBC WITH DIFFERENTIAL/PLATELET
Abs Immature Granulocytes: 3.26 10*3/uL — ABNORMAL HIGH (ref 0.00–0.07)
Basophils Absolute: 0 10*3/uL (ref 0.0–0.1)
Basophils Relative: 0 %
Eosinophils Absolute: 0 10*3/uL (ref 0.0–0.5)
Eosinophils Relative: 0 %
HCT: 36.2 % (ref 36.0–46.0)
Hemoglobin: 10.6 g/dL — ABNORMAL LOW (ref 12.0–15.0)
Immature Granulocytes: 9 %
Lymphocytes Relative: 4 %
Lymphs Abs: 1.3 10*3/uL (ref 0.7–4.0)
MCH: 28.4 pg (ref 26.0–34.0)
MCHC: 29.3 g/dL — ABNORMAL LOW (ref 30.0–36.0)
MCV: 97.1 fL (ref 80.0–100.0)
Monocytes Absolute: 2.5 10*3/uL — ABNORMAL HIGH (ref 0.1–1.0)
Monocytes Relative: 7 %
Neutro Abs: 29.1 10*3/uL — ABNORMAL HIGH (ref 1.7–7.7)
Neutrophils Relative %: 80 %
Platelets: 307 10*3/uL (ref 150–400)
RBC: 3.73 MIL/uL — ABNORMAL LOW (ref 3.87–5.11)
RDW: 14.3 % (ref 11.5–15.5)
WBC: 36.2 10*3/uL — ABNORMAL HIGH (ref 4.0–10.5)
nRBC: 1.1 % — ABNORMAL HIGH (ref 0.0–0.2)

## 2020-06-23 LAB — GLUCOSE, CAPILLARY
Glucose-Capillary: 107 mg/dL — ABNORMAL HIGH (ref 70–99)
Glucose-Capillary: 116 mg/dL — ABNORMAL HIGH (ref 70–99)
Glucose-Capillary: 131 mg/dL — ABNORMAL HIGH (ref 70–99)
Glucose-Capillary: 136 mg/dL — ABNORMAL HIGH (ref 70–99)
Glucose-Capillary: 147 mg/dL — ABNORMAL HIGH (ref 70–99)
Glucose-Capillary: 158 mg/dL — ABNORMAL HIGH (ref 70–99)
Glucose-Capillary: 159 mg/dL — ABNORMAL HIGH (ref 70–99)
Glucose-Capillary: 169 mg/dL — ABNORMAL HIGH (ref 70–99)
Glucose-Capillary: 183 mg/dL — ABNORMAL HIGH (ref 70–99)
Glucose-Capillary: 199 mg/dL — ABNORMAL HIGH (ref 70–99)
Glucose-Capillary: 216 mg/dL — ABNORMAL HIGH (ref 70–99)
Glucose-Capillary: 218 mg/dL — ABNORMAL HIGH (ref 70–99)
Glucose-Capillary: 219 mg/dL — ABNORMAL HIGH (ref 70–99)
Glucose-Capillary: 270 mg/dL — ABNORMAL HIGH (ref 70–99)
Glucose-Capillary: 283 mg/dL — ABNORMAL HIGH (ref 70–99)
Glucose-Capillary: 296 mg/dL — ABNORMAL HIGH (ref 70–99)
Glucose-Capillary: 380 mg/dL — ABNORMAL HIGH (ref 70–99)

## 2020-06-23 LAB — APTT: aPTT: 50 seconds — ABNORMAL HIGH (ref 24–36)

## 2020-06-23 LAB — TRIGLYCERIDES: Triglycerides: 816 mg/dL — ABNORMAL HIGH (ref <150)

## 2020-06-23 LAB — HEPARIN LEVEL (UNFRACTIONATED)
Heparin Unfractionated: 1.8 IU/mL — ABNORMAL HIGH (ref 0.30–0.70)
Heparin Unfractionated: 1 IU/mL — ABNORMAL HIGH (ref 0.30–0.70)
Heparin Unfractionated: 0.89 IU/mL — ABNORMAL HIGH (ref 0.30–0.70)
Heparin Unfractionated: 0.62 IU/mL (ref 0.30–0.70)

## 2020-06-23 MED ORDER — HEPARIN (PORCINE) 25000 UT/250ML-% IV SOLN
500.0000 [IU]/h | INTRAVENOUS | Status: DC
  Administered 2020-06-23: 200 [IU]/h via INTRAVENOUS
  Filled 2020-06-23: qty 250

## 2020-06-23 MED ORDER — INSULIN ASPART 100 UNIT/ML ~~LOC~~ SOLN
3.0000 [IU] | SUBCUTANEOUS | Status: DC
  Administered 2020-06-23: 6 [IU] via SUBCUTANEOUS
  Administered 2020-06-23: 3 [IU] via SUBCUTANEOUS
  Administered 2020-06-23: 6 [IU] via SUBCUTANEOUS
  Administered 2020-06-24: 9 [IU] via SUBCUTANEOUS
  Administered 2020-06-24 (×2): 6 [IU] via SUBCUTANEOUS
  Administered 2020-06-24: 3 [IU] via SUBCUTANEOUS
  Administered 2020-06-24 – 2020-06-25 (×3): 6 [IU] via SUBCUTANEOUS
  Administered 2020-06-25 (×2): 3 [IU] via SUBCUTANEOUS

## 2020-06-23 MED ORDER — INSULIN ASPART 100 UNIT/ML ~~LOC~~ SOLN
10.0000 [IU] | SUBCUTANEOUS | Status: DC
  Administered 2020-06-23 – 2020-06-25 (×13): 10 [IU] via SUBCUTANEOUS

## 2020-06-23 MED ORDER — PRISMASOL BGK 4/2.5 32-4-2.5 MEQ/L IV SOLN
INTRAVENOUS | Status: DC
  Filled 2020-06-23 (×32): qty 5000

## 2020-06-23 MED ORDER — SODIUM CHLORIDE 0.9 % FOR CRRT: INTRAVENOUS_CENTRAL | Status: DC | PRN

## 2020-06-23 MED ORDER — PRISMASOL BGK 4/2.5 32-4-2.5 MEQ/L REPLACEMENT SOLN
Status: DC
  Filled 2020-06-23 (×8): qty 5000

## 2020-06-23 MED ORDER — NOREPINEPHRINE 16 MG/250ML-% IV SOLN
0.0000 ug/min | INTRAVENOUS | Status: DC
  Administered 2020-06-23: 26 ug/min via INTRAVENOUS
  Administered 2020-06-24: 27 ug/min via INTRAVENOUS
  Administered 2020-06-24: 16 ug/min via INTRAVENOUS
  Administered 2020-06-25: 40 ug/min via INTRAVENOUS
  Administered 2020-06-25: 35 ug/min via INTRAVENOUS
  Administered 2020-06-25: 26 ug/min via INTRAVENOUS
  Filled 2020-06-23 (×6): qty 250

## 2020-06-23 MED ORDER — DEXTROSE 10 % IV SOLN: INTRAVENOUS | Status: DC | PRN

## 2020-06-23 MED ORDER — INSULIN DETEMIR 100 UNIT/ML ~~LOC~~ SOLN
31.0000 [IU] | Freq: Two times a day (BID) | SUBCUTANEOUS | Status: DC
  Administered 2020-06-23 (×2): 31 [IU] via SUBCUTANEOUS
  Filled 2020-06-23 (×5): qty 0.31

## 2020-06-23 MED ORDER — HEPARIN SODIUM (PORCINE) 1000 UNIT/ML DIALYSIS
1000.0000 [IU] | INTRAMUSCULAR | Status: DC | PRN
  Administered 2020-06-23 – 2020-06-25 (×2): 2400 [IU] via INTRAVENOUS_CENTRAL
  Filled 2020-06-23: qty 6
  Filled 2020-06-23: qty 4
  Filled 2020-06-23 (×2): qty 6

## 2020-06-23 MED ORDER — FAMOTIDINE IN NACL 20-0.9 MG/50ML-% IV SOLN
20.0000 mg | INTRAVENOUS | Status: DC
  Administered 2020-06-23 – 2020-06-25 (×3): 20 mg via INTRAVENOUS
  Filled 2020-06-23 (×3): qty 50

## 2020-06-23 MED ORDER — PREGABALIN 100 MG PO CAPS
100.0000 mg | ORAL_CAPSULE | Freq: Every day | ORAL | Status: DC
  Administered 2020-06-24 – 2020-06-25 (×2): 100 mg
  Filled 2020-06-23 (×2): qty 1

## 2020-06-23 MED ORDER — BARICITINIB 2 MG PO TABS
2.0000 mg | ORAL_TABLET | Freq: Every day | ORAL | Status: DC
  Administered 2020-06-24 – 2020-06-25 (×2): 2 mg
  Filled 2020-06-23 (×2): qty 1

## 2020-06-23 NOTE — Progress Notes (Signed)
Pt placed in proning position at this time. No apparent complications. VS are within normal limits. ETT secured in proper position with cloth tape along with mepilex on left & right cheek, right ear, and chin. Will continue to monitor pt.

## 2020-06-23 NOTE — Progress Notes (Signed)
[place] for Heparin  Indication: DVT  Allergies  Allergen Reactions  . Effexor [Venlafaxine Hcl] Other (See Comments)    Pt becomes suicidal  . Percocet [Oxycodone-Acetaminophen] Nausea And Vomiting  . Vicodin [Hydrocodone-Acetaminophen] Nausea And Vomiting    Patient Measurements: Height: 5' 3.75" (161.9 cm) Weight: 95 kg (209 lb 7 oz) IBW/kg (Calculated) : 54.13 Heparin Dosing Weight: 74.9kg  Vital Signs: Temp: 95 F (35 C) (09/01 1800) Temp Source: Rectal (09/01 1800) BP: 105/50 (09/01 1442) Pulse Rate: 131 (09/01 1830)  Labs: Recent Labs    06/21/20 0402 06/21/20 0557 06/22/20 0309 06/22/20 1043 06/22/20 2200 06/23/20 0500 06/23/20 0500 06/23/20 0900 06/23/20 1154 06/23/20 1301 06/23/20 1617 06/23/20 1635 06/23/20 1705  HGB 10.7*   < > 12.3   < >  --  10.6*   < >  --  10.9*  --  10.2*  --   --   HCT 37.8   < > 42.9   < >  --  36.2  --   --  32.0*  --  30.0*  --   --   PLT 149*  --  232  --   --  307  --   --   --   --   --   --   --   APTT  --   --   --   --   --   --   --  50*  --   --   --   --   --   HEPARINUNFRC  --    < >  --    < >   < >  --   --  1.00*  --  0.89*  --   --  0.62  CREATININE  --    < > 2.45*  --   --  3.76*  --   --   --   --   --  3.95*  --    < > = values in this interval not displayed.    Estimated Creatinine Clearance: 20.6 mL/min (A) (by C-G formula based on SCr of 3.95 mg/dL (H)).  Assessment: 44yoF admitted for acute respiratory failure secondary to Covid infection. Dopplers were ordered d/t new signs of VTE including D-dimer elevation from 0.85 to >20 and 'Covid toes' on exam. LLE DVT involving multiple veins found on ultrasound.   Patient noted to have bleeding from mouth and nose, and RN noticed large hardended bruise on patient's side.  -heparin level= 0.62 (heparin was stopped around 8am today)  Spoke to Dr. Tamala Julian and will restart heparin with conservative dosing (previous rate was  550 units/hr), no bolus  Goal of Therapy:  Heparin level= 0.3-0.5 Monitor platelets by anticoagulation protocol: Yes   Plan:  -restart heparin at 200 units/hr -Heparin level in 6 hours and daily wth CBC daily  Hildred Laser, PharmD Clinical Pharmacist **Pharmacist phone directory can now be found on [email or web address] ([email or web address]).  Listed under [place].

## 2020-06-23 NOTE — Progress Notes (Deleted)
Pt placed on nitric at 20ppm per order. Vent changes documented.

## 2020-06-23 NOTE — Plan of Care (Signed)
Pt's husband notified me about code status after discussion with pt's parents and duaghters. They all agree pt would not want long term support and while want to be aggressive to hopefully heal and improve pt would not want cpr, shocks etc should she be pulseless.   Should pt cont to decline and warrant increasing pressors or unable to oxygenate will contact back as he would want to see pt and be with her as able.

## 2020-06-23 NOTE — Progress Notes (Signed)
Pt's head turned to the right and arms rotated with no complications. ETT secured at 23 at the lips.

## 2020-06-23 NOTE — Progress Notes (Signed)
NAME:  Lisa [name], MRN:  [id number], DOB:  [birth year], LOS: 6 ADMISSION DATE:  06/03/2020, CONSULTATION DATE: 06/17/2020 REFERRING MD: Dr. Aleene Davidson, CHIEF COMPLAINT: COVID-19 pneumonia  Brief History   44 year old female, no significant past medical history admitted for bilateral pneumonia, COVID-19 positive.  Pulmonary critical care consulted for evaluation as she is on 100% heated high flow 40 L.  History of present illness   This is a 44 year old female with no significant past medical history except for asthma, BMI 30, recent sick contact, daughter testing positive for COVID-19.  Patient was diagnosed on Monday symptoms started last weekend after known sick contact with patient's daughter who tested positive she was found to be hypoxic in the emergency department.  Initial labs revealed elevated white count of 11,000, initial CRP of 22, D-dimer of 0.85, glucose 171, serum creatinine of 1.  Patient has had progressive hypoxia in the ER today.  Had escalating delivery mechanisms for oxygen.  At this point on heated high flow nasal cannula at 100% and 40 L.  She feels comfortable breathing on this.  She does not feel labored.  She is able to communicate in full sentences.  However O2 sats dropped into the high 70s.  If she rests comfortably her O2 sats maintained at about 88%.  We discussed the utility of self proning.  Patient was self proned in the emergency department on heated high flow and her O2 sats came up to 93%.  Past Medical History   Past Medical History:  Diagnosis Date  . Asthma 06/17/2020  . Eczema   . Fibromyalgia    lyrica-pain in legs and arms-controlled  . Seasonal allergies      Significant Hospital Events   ICU admission  Consults:  Pulmonary critical care Nephrology 9/1  Procedures:  ETT 8/29 Vas cath 9/1  Significant Diagnostic Tests:   8/30 LE Doppler: RIGHT:  - There is no evidence of deep vein thrombosis in the lower extremity.    - No cystic  structure found in the popliteal fossa.    LEFT:  - Findings consistent with acute deep vein thrombosis involving the left  posterior tibial veins, and left peroneal veins.  - No cystic structure found in the popliteal fossa.     Micro Data:  COVID-19 PCR: Positive  Antimicrobials:  remdesivir 8/25-8/30 baricitinib 8/26- Interim history/subjective:  9/1: pt cont to decline. Minimal improvement with proning overnight with oxygen but still acidotic. Now req pressors. Started paralytic yseterday afternoon and have intermittently utilized it 2/2 TOF. Renal function worsened again and uop <243ml. Consulted renal and placing vas cath for crrt. Pt is now bleeding from mouth and nose, heparin held and are reassessing levels. Lengthy d/w husband about MOSF and high mortality are ongoing. Did discuss the difficult decisions of code status in this very young but unfortunately very sick individual. He has had conversations with her that she would not want to "exist on machines". I did advise him that if she survives this she will likely req trach and vent wean in facility. He endorsed understanding, he also expressed understanding that should she suffer a cardiac arrest her likely dependence on machines (if survives) would be increased.  8/31:No further paralytics required at this time. Remains proned on 70%. New le dvt noted and started on heparin gtt overnight  Objective   Blood pressure (!) 97/48, pulse (!) 119, temperature 98.6 F (37 C), temperature source Rectal, resp. rate (!) 32, height 5' 3.75" (1.619 m), weight 95  kg, SpO2 90 %.     Body mass index is 36.23 kg/m.   Vent Mode: PRVC FiO2 (%):  [70 %] 70 % Set Rate:  [35 bmp] 35 bmp Vt Set:  [420 mL] 420 mL PEEP:  [18 cmH20] 18 cmH20 Plateau Pressure:  [31 cmH20-33 cmH20] 33 cmH20   Intake/Output Summary (Last 24 hours) at 06/23/2020 1031 Last data filed at 06/23/2020 1004 Gross per 24 hour  Intake 2820.25 ml  Output 85 ml  Net 2735.25  ml   Filed Weights   06/21/20 0420 06/22/20 0500 06/23/20 0500  Weight: 91.5 kg 94.1 kg 95 kg   Physical Exam: General: Adult female laying proned, sedated HENT: [place], AT, ETT in place Respiratory: clear with exception of rales in base Cardiovascular: RRR, -M/R/G, no JVD GI: BS+, soft, nontender Extremities: Mottled appearance of bilateral feet with dusky LE digits.diffuse anasarca, Dopplers with pulses present Neuro: Sedated GU: Foley in place  Resolved Hospital Problem list     Assessment & Plan:   Acute hypoxemic respiratory failure/ARDS secondary to COVID-19 pneumonia Sepsis secondary to COVID-19  Mechanical ventilation with lung protective strategy:  Proning protocol for P:F <150 VAP prevention protocol Goal euvolemia: starting crrt Remdesivir completed Cont on steroids, baricitinib  Continue to trend inflammatory markers  Complete 5 day course of CAP coverage 8/29 PAD protocol for RASS goal -4: Propofol and Fentanyl and versed... goal to stop propofol 2/2 elevated tg  Asthma  Continue on IV steroids as above Pulmicort BID Duonebs q4h  Oliguric AKI Monitor UOP/Cr Avoid nephrotoxic agents Consulting renal  Anxiety Sedation as noted above  LLE DVT: -heparin gtt on hold for now  -reassessing labs  Hyperglycemia:  -insulin infusion.  -not dm2 at baseline per a1c  Goals of care:  -cont discussion with husband via phone -pt has previously expressed would not want longterm mechanical support  Best practice:  Diet: TF Pain/Anxiety/Delirium protocol (if indicated): PAD protocol VAP protocol (if indicated): Yes DVT prophylaxis: heparin gtt  GI prophylaxis: H2 blocker Glucose control: insulin ingusion Mobility: bed rest Code Status: Full code Family Communication: updated husband via phone Disposition: ICU  Labs   CBC: Recent Labs  Lab 06/19/20 0723 06/19/20 0723 06/20/20 0404 06/20/20 0628 06/21/20 0402 06/21/20 1120 06/22/20 0309  06/22/20 1043 06/22/20 1519 06/22/20 1808 06/23/20 0500  WBC 15.2*  --  17.5*  --  13.6*  --  16.4*  --   --   --  36.2*  NEUTROABS 13.2*  --  15.1*  --  10.9*  --  13.1*  --   --   --  29.1*  HGB 12.4   < > 13.2   < > 10.7*   < > 12.3 12.9 11.6* 11.6* 10.6*  HCT 40.9   < > 45.6   < > 37.8   < > 42.9 38.0 34.0* 34.0* 36.2  MCV 89.3  --  94.0  --  100.0  --  98.6  --   --   --  97.1  PLT 216  --  131*  --  149*  --  232  --   --   --  307   < > = values in this interval not displayed.    Basic Metabolic Panel: Recent Labs  Lab 06/19/20 0723 06/19/20 1001 06/20/20 0404 06/20/20 0628 06/20/20 1137 06/20/20 1708 06/20/20 2000 06/21/20 0557 06/21/20 1120 06/21/20 1709 06/21/20 1950 06/22/20 0309 06/22/20 1043 06/22/20 1519 06/22/20 1808 06/23/20 0500  NA   < >  144 146*   < >  --    < >  --  147*   < >  --    < > 143 141 142 142 142  K   < > 4.4 4.7   < >  --    < >  --  4.5   < >  --    < > 4.6 4.1 3.6 3.6 3.7  CL  --  110 112*  --   --   --   --  111  --   --   --  109  --   --   --  107  CO2  --  26 23  --   --   --   --  27  --   --   --  23  --   --   --  22  GLUCOSE  --  177* 191*  --   --   --   --  388*  --   --   --  460*  --   --   --  275*  BUN  --  19 31*  --   --   --   --  42*  --   --   --  63*  --   --   --  87*  CREATININE  --  0.84 0.89  --   --   --   --  1.42*  --   --   --  2.45*  --   --   --  3.76*  CALCIUM  --  7.8* 7.8*  --   --   --   --  7.6*  --   --   --  7.5*  --   --   --  7.5*  MG   < >  --  2.7*  --  2.6*  --  2.8* 3.1*  --  3.1*  --   --   --   --   --   --   PHOS   < >  --  3.0   < > 4.1  --  5.3* 5.1*  --  4.8*  --  5.8*  --   --   --   --    < > = values in this interval not displayed.   GFR: Estimated Creatinine Clearance: 21.7 mL/min (A) (by C-G formula based on SCr of 3.76 mg/dL (H)). Recent Labs  Lab 05/26/2020 2224 06/17/20 0905 06/20/20 0404 06/21/20 0402 06/22/20 0309 06/23/20 0500  PROCALCITON 1.67  --   --   --   --   --    WBC 11.0*   < > 17.5* 13.6* 16.4* 36.2*  LATICACIDVEN 1.0  --   --   --   --   --    < > = values in this interval not displayed.    Liver Function Tests: Recent Labs  Lab 06/19/20 1001 06/20/20 0404 06/21/20 0557 06/22/20 0309 06/23/20 0500  AST 29 27 16 28 25   ALT 25 20 16 16 19   ALKPHOS 58 79 75 58 70  BILITOT 0.4 0.2* 0.5 0.3 0.7  PROT 5.9* 5.6* 5.3* 4.9* 4.5*  ALBUMIN 2.2* 2.0* 2.0* 2.0* 1.9*   No results for input(s): LIPASE, AMYLASE in the last 168 hours. No results for input(s): AMMONIA in the last 168 hours.  ABG    Component Value Date/Time   PHART 7.120 (LL) 06/22/2020 1808  PCO2ART 78.5 (HH) 06/22/2020 1808   PO2ART 104 06/22/2020 1808   HCO3 25.5 06/22/2020 1808   TCO2 28 06/22/2020 1808   ACIDBASEDEF 5.0 (H) 06/22/2020 1808   O2SAT 95.0 06/22/2020 1808     Coagulation Profile: No results for input(s): INR, PROTIME in the last 168 hours.  Cardiac Enzymes: No results for input(s): CKTOTAL, CKMB, CKMBINDEX, TROPONINI in the last 168 hours.  HbA1C: Hgb A1c MFr Bld  Date/Time Value Ref Range Status  06/18/2020 06:38 AM 5.6 4.8 - 5.6 % Final    Comment:    (NOTE) Pre diabetes:          5.7%-6.4%  Diabetes:              >6.4%  Glycemic control for   <7.0% adults with diabetes     CBG: Recent Labs  Lab 06/23/20 0446 06/23/20 0551 06/23/20 0657 06/23/20 0756 06/23/20 0958  GLUCAP 116* 218* 219* 216* 159*   Critical care time: The patient is critically ill with multiple organ systems failure and requires high complexity decision making for assessment and support, frequent evaluation and titration of therapies, application of advanced monitoring technologies and extensive interpretation of multiple databases.  Critical care time 56 mins. This represents my time independent of the NPs time taking care of the pt. This is excluding procedures.    Audria Nine DO [facility] 06/23/2020, 10:31 AM

## 2020-06-23 NOTE — Progress Notes (Signed)
Pt placed in supine position at this time. No apparent complications. VS are within normal limits. ETT secured in proper position with commercial tube holder. Pt did have break down on right check & chin and bleeding from nose. RN was notified.

## 2020-06-23 NOTE — Progress Notes (Signed)
[place] for Heparin  Indication: DVT  Allergies  Allergen Reactions  . Effexor [Venlafaxine Hcl] Other (See Comments)    Pt becomes suicidal  . Percocet [Oxycodone-Acetaminophen] Nausea And Vomiting  . Vicodin [Hydrocodone-Acetaminophen] Nausea And Vomiting    Patient Measurements: Height: 5' 3.75" (161.9 cm) Weight: 95 kg (209 lb 7 oz) IBW/kg (Calculated) : 54.13 Heparin Dosing Weight: 74.9kg  Vital Signs: Temp: 98.6 F (37 C) (09/01 0830) Temp Source: Rectal (09/01 0830) BP: 97/48 (09/01 0735) Pulse Rate: 119 (09/01 0915)  Labs: Recent Labs    06/21/20 0402 06/21/20 0557 06/21/20 1120 06/22/20 0309 06/22/20 1043 06/22/20 1045 06/22/20 1808 06/22/20 1808 06/22/20 2200 06/23/20 0500 06/23/20 0900 06/23/20 1154 06/23/20 1301  HGB 10.7*  --    < > 12.3   < >  --  11.6*   < >  --  10.6*  --  10.9*  --   HCT 37.8  --    < > 42.9   < >  --  34.0*  --   --  36.2  --  32.0*  --   PLT 149*  --   --  232  --   --   --   --   --  307  --   --   --   APTT  --   --   --   --   --   --   --   --   --   --  50*  --   --   HEPARINUNFRC  --   --    < >  --    < >   < >  --   --  1.80*  --  1.00*  --  0.89*  CREATININE  --  1.42*  --  2.45*  --   --   --   --   --  3.76*  --   --   --    < > = values in this interval not displayed.    Estimated Creatinine Clearance: 21.7 mL/min (A) (by C-G formula based on SCr of 3.76 mg/dL (H)).  Assessment: 44yoF admitted for acute respiratory failure secondary to Covid infection. Dopplers were ordered d/t new signs of VTE including D-dimer elevation from 0.85 to >20 and 'Covid toes' on exam. LLE DVT involving multiple veins found on ultrasound.   Today, patient has remained supratherapeutic at 0.89 despite holding heparin for 4 hours this AM. Patient noted to have bleeding from mouth and nose, and RN noticed large hardended bruise on patient's side. RN verified gtt held appropriately throughout the day.  Hgb 10.9, Plt 307. Scr significantly increased again today at 3.76. Patient to start CRRT this evening.   Goal of Therapy:  Heparin level 0.3-0.7 units/ml Monitor platelets by anticoagulation protocol: Yes   Plan:  Continue to hold heparin for now, consider resuming if normalized on HL recheck Check heparin level in 4 hours Monitor daily CBC  Monitor for further signs of bleeding   Claudina Lick, PharmD PGY1 Acute Care Pharmacy Resident 06/23/2020 2:14 PM  Please check [email or web address] for unit-specific pharmacy phone numbers.

## 2020-06-23 NOTE — Progress Notes (Signed)
[place] for Heparin  Indication: DVT  Allergies  Allergen Reactions  . Effexor [Venlafaxine Hcl] Other (See Comments)    Pt becomes suicidal  . Percocet [Oxycodone-Acetaminophen] Nausea And Vomiting  . Vicodin [Hydrocodone-Acetaminophen] Nausea And Vomiting    Patient Measurements: Height: 5' 3.75" (161.9 cm) Weight: 94.1 kg (207 lb 7.3 oz) IBW/kg (Calculated) : 54.13 Heparin Dosing Weight: 74.9kg  Vital Signs: Temp: 98.2 F (36.8 C) (08/31 2000) Temp Source: Rectal (08/31 2000) BP: 147/80 (08/31 1309) Pulse Rate: 116 (08/31 2200)  Labs: Recent Labs    06/20/20 0404 06/20/20 4132 06/21/20 0402 06/21/20 0557 06/21/20 1120 06/21/20 1950 06/21/20 2210 06/22/20 0309 06/22/20 0309 06/22/20 1043 06/22/20 1043 06/22/20 1045 06/22/20 1519 06/22/20 1808 06/22/20 2200  HGB 13.2   < > 10.7*  --    < >   < >  --  12.3   < > 12.9   < >  --  11.6* 11.6*  --   HCT 45.6   < > 37.8  --    < >   < >  --  42.9   < > 38.0  --   --  34.0* 34.0*  --   PLT 131*  --  149*  --   --   --   --  232  --   --   --   --   --   --   --   HEPARINUNFRC  --   --   --   --   --   --  2.02*  --   --   --   --  >2.20*  --   --  1.80*  CREATININE 0.89  --   --  1.42*  --   --   --  2.45*  --   --   --   --   --   --   --    < > = values in this interval not displayed.    Estimated Creatinine Clearance: 33.1 mL/min (A) (by C-G formula based on SCr of 2.45 mg/dL (H)).  Assessment: 44 y.o. female with DVT for heparin  Goal of Therapy:  Heparin level 0.3-0.7 units/ml Monitor platelets by anticoagulation protocol: Yes   Plan:  Hold heparin x 2.5 hours, then decrease heparin 550 units/hr Check heparin level in 8 hours.  Phillis Knack, PharmD, BCPS

## 2020-06-23 NOTE — Progress Notes (Signed)
Pt head turned to left. No complications. Suctioned airway and mouth. Rt and 2 nurses.

## 2020-06-23 NOTE — Consult Note (Signed)
[facility] Renal Consultation Note  Requesting MD:  Indication for Consultation: Acute kidney injury, maintenance of euvolemia, treatment and assessment of acid-base disorders, treatment and assessment of electrolyte abnormalities.  HPI:   Lisa [name] is a 44 y.o. female.  She was admitted on 05/29/2020 with Covid pneumonia.  She developed acute hypoxic respiratory failure secondary to ARDS and Covid 19 pneumonia and sepsis.  She required mechanical ventilation.  Her baseline serum creatinine on 06/20/2020 was 0.89 mg/dL on 06/21/2020 increased to 1.4 mg deciliter 06/22/2020 2.4 mg deciliter 06/23/2020 3.76 mg/dL.  Urine output has been dwindling.  06/22/2000 421 cc.  She is adequately volume resuscitated and positive balance about 5 L.  Blood pressure 97/46 pulse 114 temperature 98.1 O2 sats 93% FiO2 70%  IV norepinephrine IV heparin  Sodium 142 potassium 3.7 chloride 107 CO2 22 BUN 87 creatinine 3.76 glucose 275 calcium 7.5 albumin 1.9 hemoglobin 10.6 white blood count 36.2  Baricitinib 1 mg daily  Solu-Medrol 0.5 mg/kg twice daily     Creatinine, Ser  Date/Time Value Ref Range Status  06/23/2020 05:00 AM 3.76 (H) 0.44 - 1.00 mg/dL Final    Comment:    DELTA CHECK NOTED  06/22/2020 03:09 AM 2.45 (H) 0.44 - 1.00 mg/dL Final    Comment:    DELTA CHECK NOTED  06/21/2020 05:57 AM 1.42 (H) 0.44 - 1.00 mg/dL Final  06/20/2020 04:04 AM 0.89 0.44 - 1.00 mg/dL Final  06/19/2020 10:01 AM 0.84 0.44 - 1.00 mg/dL Final  06/18/2020 06:38 AM 0.87 0.44 - 1.00 mg/dL Final  06/17/2020 09:05 AM 1.06 (H) 0.44 - 1.00 mg/dL Final  06/19/2020 10:24 PM 1.11 (H) 0.44 - 1.00 mg/dL Final     PMHx:   Past Medical History:  Diagnosis Date  . Asthma 06/17/2020  . Eczema   . Fibromyalgia    lyrica-pain in legs and arms-controlled  . Seasonal allergies     Past Surgical History:  Procedure Laterality Date  . DILATION AND CURETTAGE OF UTERUS  2012  . LAPAROSCOPIC ASSISTED VAGINAL  HYSTERECTOMY  09/02/2012   Procedure: LAPAROSCOPIC ASSISTED VAGINAL HYSTERECTOMY;  Surgeon: Darlyn Chamber, MD;  Location: [facility];  Service: Gynecology;  Laterality: N/A;  with cystoscopy  . LIPOMA EXCISION     local  . MOUTH SURGERY  2013  . NASAL SINUS SURGERY    . SEPTOPLASTY    . TUBAL LIGATION    . WISDOM TOOTH EXTRACTION      Family Hx:  Family History  Problem Relation Age of Onset  . Diabetes Mellitus II Mother     Social History:  reports that she has never smoked. She does not have any smokeless tobacco history on file. She reports current alcohol use. She reports that she does not use drugs.  Allergies:  Allergies  Allergen Reactions  . Effexor [Venlafaxine Hcl] Other (See Comments)    Pt becomes suicidal  . Percocet [Oxycodone-Acetaminophen] Nausea And Vomiting  . Vicodin [Hydrocodone-Acetaminophen] Nausea And Vomiting    Medications: Prior to Admission medications   Medication Sig Start Date End Date Taking? Authorizing Provider  albuterol (VENTOLIN HFA) 108 (90 Base) MCG/ACT inhaler Inhale into the lungs every 6 (six) hours as needed for wheezing or shortness of breath.   Yes [provider]  celecoxib (CELEBREX) 200 MG capsule Take 200 mg by mouth 2 (two) times daily.   Yes [provider]  diphenhydrAMINE (BENADRYL) 25 mg capsule Take 25 mg by mouth every 6 (six) hours as needed.  For allergies   Yes [provider]  ergocalciferol (VITAMIN D2) 1.25 MG (50000 UT) capsule Take 50,000 Units by mouth every Tuesday.    Yes [provider]  fluticasone furoate-vilanterol (BREO ELLIPTA) 200-25 MCG/INH AEPB Inhale 1 puff into the lungs daily.   Yes [provider]  montelukast (SINGULAIR) 10 MG tablet Take 10 mg by mouth daily.    Yes [provider]  pregabalin (LYRICA) 200 MG capsule Take 200 mg by mouth at bedtime.    Yes [provider]  vitamin B-12 (CYANOCOBALAMIN) 1000 MCG tablet Take 1,000 mcg by mouth  every other day.    Yes [provider]      Labs:  Results for orders placed or performed during the hospital encounter of 05/28/2020 (from the past 48 hour(s))  Triglycerides     Status: Abnormal   Collection Time: 06/21/20 10:28 AM  Result Value Ref Range   Triglycerides 412 (H) <150 mg/dL    Comment: Performed at [facility], [place] [address], [place], [place] [address]  I-STAT 7, (LYTES, BLD GAS, ICA, H+H)     Status: Abnormal   Collection Time: 06/21/20 11:20 AM  Result Value Ref Range   pH, Arterial 7.125 (LL) 7.35 - 7.45   pCO2 arterial 84.8 (HH) 32 - 48 mmHg   pO2, Arterial 71 (L) 83 - 108 mmHg   Bicarbonate 27.9 20.0 - 28.0 mmol/L   TCO2 30 22 - 32 mmol/L   O2 Saturation 86.0 %   Acid-base deficit 4.0 (H) 0.0 - 2.0 mmol/L   Sodium 149 (H) 135 - 145 mmol/L   Potassium 4.5 3.5 - 5.1 mmol/L   Calcium, Ion 1.14 (L) 1.15 - 1.40 mmol/L   HCT 40.0 36 - 46 %   Hemoglobin 13.6 12.0 - 15.0 g/dL   Collection site art line    Drawn by RT    Sample type ARTERIAL   Glucose, capillary     Status: Abnormal   Collection Time: 06/21/20 11:38 AM  Result Value Ref Range   Glucose-Capillary 335 (H) 70 - 99 mg/dL    Comment: Glucose reference range applies only to samples taken after fasting for at least 8 hours.  Glucose, capillary     Status: Abnormal   Collection Time: 06/21/20  3:16 PM  Result Value Ref Range   Glucose-Capillary 330 (H) 70 - 99 mg/dL    Comment: Glucose reference range applies only to samples taken after fasting for at least 8 hours.  I-STAT 7, (LYTES, BLD GAS, ICA, H+H)     Status: Abnormal   Collection Time: 06/21/20  4:52 PM  Result Value Ref Range   pH, Arterial 7.145 (LL) 7.35 - 7.45   pCO2 arterial 76.3 (HH) 32 - 48 mmHg   pO2, Arterial 77 (L) 83 - 108 mmHg   Bicarbonate 26.3 20.0 - 28.0 mmol/L   TCO2 29 22 - 32 mmol/L   O2 Saturation 90.0 %   Acid-base deficit 4.0 (H) 0.0 - 2.0 mmol/L   Sodium 144 135 - 145 mmol/L   Potassium 4.8 3.5 - 5.1  mmol/L   Calcium, Ion 1.10 (L) 1.15 - 1.40 mmol/L   HCT 39.0 36 - 46 %   Hemoglobin 13.3 12.0 - 15.0 g/dL   Collection site Radial    Drawn by RT    Sample type ARTERIAL   Magnesium     Status: Abnormal   Collection Time: 06/21/20  5:09 PM  Result Value Ref Range  Magnesium 3.1 (H) 1.7 - 2.4 mg/dL    Comment: Performed at [facility], [place] [address] [place], [place] [address]  Phosphorus     Status: Abnormal   Collection Time: 06/21/20  5:09 PM  Result Value Ref Range   Phosphorus 4.8 (H) 2.5 - 4.6 mg/dL    Comment: Performed at [facility] [address] [place], [place] [address]  I-STAT 7, (LYTES, BLD GAS, ICA, H+H)     Status: Abnormal   Collection Time: 06/21/20  7:50 PM  Result Value Ref Range   pH, Arterial 7.102 (LL) 7.35 - 7.45   pCO2 arterial 88.7 (HH) 32 - 48 mmHg   pO2, Arterial 108 83 - 108 mmHg   Bicarbonate 26.8 20.0 - 28.0 mmol/L   TCO2 29 22 - 32 mmol/L   O2 Saturation 94.0 %   Acid-base deficit 4.0 (H) 0.0 - 2.0 mmol/L   Sodium 145 135 - 145 mmol/L   Potassium 4.8 3.5 - 5.1 mmol/L   Calcium, Ion 1.11 (L) 1.15 - 1.40 mmol/L   HCT 39.0 36 - 46 %   Hemoglobin 13.3 12.0 - 15.0 g/dL   Patient temperature 102.9 F    Collection site Radial    Drawn by RT    Sample type ARTERIAL   Glucose, capillary     Status: Abnormal   Collection Time: 06/21/20  8:09 PM  Result Value Ref Range   Glucose-Capillary 343 (H) 70 - 99 mg/dL    Comment: Glucose reference range applies only to samples taken after fasting for at least 8 hours.  Heparin level (unfractionated)     Status: Abnormal   Collection Time: 06/21/20 10:10 PM  Result Value Ref Range   Heparin Unfractionated 2.02 (H) 0.30 - 0.70 IU/mL    Comment: RESULTS CONFIRMED BY MANUAL DILUTION (NOTE) If heparin results are below expected values, and patient dosage has  been confirmed, suggest follow up testing of antithrombin III levels. Performed at [facility], [place] [address],  [place], [place] [address]   Glucose, capillary     Status: Abnormal   Collection Time: 06/21/20 11:47 PM  Result Value Ref Range   Glucose-Capillary 356 (H) 70 - 99 mg/dL    Comment: Glucose reference range applies only to samples taken after fasting for at least 8 hours.  Phosphorus     Status: Abnormal   Collection Time: 06/22/20  3:09 AM  Result Value Ref Range   Phosphorus 5.8 (H) 2.5 - 4.6 mg/dL    Comment: Performed at [facility] [address] [place], [place] [address]  D-dimer, quantitative (not at [facility])     Status: Abnormal   Collection Time: 06/22/20  3:09 AM  Result Value Ref Range   D-Dimer, Quant >20.00 (H) 0.00 - 0.50 ug/mL-FEU    Comment: (NOTE) At the manufacturer cut-off of 0.50 ug/mL FEU, this assay has been documented to exclude PE with a sensitivity and negative predictive value of 97 to 99%.  At this time, this assay has not been approved by the FDA to exclude DVT/VTE. Results should be correlated with clinical presentation. Performed at [facility], [place] [address] [place], [place] [address]   C-reactive protein     Status: Abnormal   Collection Time: 06/22/20  3:09 AM  Result Value Ref Range   CRP 5.2 (H) <1.0 mg/dL    Comment: Performed at [facility] [address] [place], [place] [address]  Ferritin     Status: Abnormal   Collection Time: 06/22/20  3:09 AM  Result Value Ref Range   Ferritin 674 (H) 11 - 307 ng/mL    Comment: Performed at [facility], [place] [address] [place], [place] [address]  Fibrinogen     Status: None   Collection Time: 06/22/20  3:09 AM  Result Value Ref Range   Fibrinogen 214 210 - 475 mg/dL    Comment: Performed at [facility] [address] [place], [place] [address]  Lactate dehydrogenase     Status: Abnormal   Collection Time: 06/22/20  3:09 AM  Result Value Ref Range   LDH 673 (H) 98 - 192 U/L    Comment: Performed at [facility], [place] [address] [place], [place] [address]   Sedimentation rate     Status: None   Collection Time: 06/22/20  3:09 AM  Result Value Ref Range   Sed Rate 10 0 - 22 mm/hr    Comment: Performed at [facility] [address], [place], [place] [address]  Triglycerides     Status: Abnormal   Collection Time: 06/22/20  3:09 AM  Result Value Ref Range   Triglycerides 563 (H) <150 mg/dL    Comment: Performed at [facility] [address] [place], [place] [address]  CBC with Differential/Platelet     Status: Abnormal   Collection Time: 06/22/20  3:09 AM  Result Value Ref Range   WBC 16.4 (H) 4.0 - 10.5 K/uL   RBC 4.35 3.87 - 5.11 MIL/uL   Hemoglobin 12.3 12.0 - 15.0 g/dL   HCT 42.9 36 - 46 %   MCV 98.6 80.0 - 100.0 fL   MCH 28.3 26.0 - 34.0 pg   MCHC 28.7 (L) 30.0 - 36.0 g/dL   RDW 14.8 11.5 - 15.5 %   Platelets 232 150 - 400 K/uL   nRBC 0.9 (H) 0.0 - 0.2 %   Neutrophils Relative % 79 %   Neutro Abs 13.1 (H) 1.7 - 7.7 K/uL   Band Neutrophils 1 %   Lymphocytes Relative 4 %   Lymphs Abs 0.7 0.7 - 4.0 K/uL   Monocytes Relative 1 %   Monocytes Absolute 0.2 0 - 1 K/uL   Eosinophils Relative 0 %   Eosinophils Absolute 0.0 0 - 0 K/uL   Basophils Relative 0 %   Basophils Absolute 0.0 0 - 0 K/uL   WBC Morphology See Note     Comment: Mild Left Shift. 1 to 5% Metas and Myelos, Occ Pro Noted.   nRBC 2 (H) 0 /100 WBC   Metamyelocytes Relative 5 %   Myelocytes 10 %   Abs Immature Granulocytes 2.50 (H) 0.00 - 0.07 K/uL   Acanthocytes PRESENT     Comment: Performed at [facility] [address], [place],  [address]  Comprehensive metabolic panel     Status: Abnormal   Collection Time: 06/22/20  3:09 AM  Result Value Ref Range   Sodium 143 135 - 145 mmol/L   Potassium 4.6 3.5 - 5.1 mmol/L   Chloride 109 98 - 111 mmol/L   CO2 23 22 - 32 mmol/L   Glucose, Bld 460 (H) 70 - 99 mg/dL    Comment: Glucose reference range applies only to samples taken after fasting for at least 8 hours.   BUN 63 (H) 6 - 20  mg/dL   Creatinine, Ser 2.45 (H) 0.44 -  1.00 mg/dL    Comment: DELTA CHECK NOTED   Calcium 7.5 (L) 8.9 - 10.3 mg/dL   Total Protein 4.9 (L) 6.5 - 8.1 g/dL   Albumin 2.0 (L) 3.5 - 5.0 g/dL   AST 28 15 - 41 U/L   ALT 16 0 - 44 U/L   Alkaline Phosphatase 58 38 - 126 U/L   Total Bilirubin 0.3 0.3 - 1.2 mg/dL   GFR calc non Af Amer 23 (L) >60 mL/min   GFR calc Af Amer 27 (L) >60 mL/min   Anion gap 11 5 - 15    Comment: Performed at [facility] [address] [place], [place] [address]  Glucose, capillary     Status: Abnormal   Collection Time: 06/22/20  3:44 AM  Result Value Ref Range   Glucose-Capillary 414 (H) 70 - 99 mg/dL    Comment: Glucose reference range applies only to samples taken after fasting for at least 8 hours.  Glucose, capillary     Status: Abnormal   Collection Time: 06/22/20  8:19 AM  Result Value Ref Range   Glucose-Capillary 490 (H) 70 - 99 mg/dL    Comment: Glucose reference range applies only to samples taken after fasting for at least 8 hours.  I-STAT 7, (LYTES, BLD GAS, ICA, H+H)     Status: Abnormal   Collection Time: 06/22/20 10:43 AM  Result Value Ref Range   pH, Arterial 7.026 (LL) 7.35 - 7.45   pCO2 arterial 88.5 (HH) 32 - 48 mmHg   pO2, Arterial 86 83 - 108 mmHg   Bicarbonate 23.2 20.0 - 28.0 mmol/L   TCO2 26 22 - 32 mmol/L   O2 Saturation 89.0 %   Acid-base deficit 9.0 (H) 0.0 - 2.0 mmol/L   Sodium 141 135 - 145 mmol/L   Potassium 4.1 3.5 - 5.1 mmol/L   Calcium, Ion 1.12 (L) 1.15 - 1.40 mmol/L   HCT 38.0 36 - 46 %   Hemoglobin 12.9 12.0 - 15.0 g/dL   Collection site art line    Drawn by RT    Sample type ARTERIAL   Heparin level (unfractionated)     Status: Abnormal   Collection Time: 06/22/20 10:45 AM  Result Value Ref Range   Heparin Unfractionated >2.20 (H) 0.30 - 0.70 IU/mL    Comment: LIPEMIC SPECIMEN, RESULTS MAY BE AFFECTED. RESULTS CONFIRMED BY MANUAL DILUTION (NOTE) If heparin results are below expected values, and patient  dosage has  been confirmed, suggest follow up testing of antithrombin III levels. Performed at [facility], [place] [address] [place], [place] [address]   Glucose, capillary     Status: Abnormal   Collection Time: 06/22/20 12:32 PM  Result Value Ref Range   Glucose-Capillary 448 (H) 70 - 99 mg/dL    Comment: Glucose reference range applies only to samples taken after fasting for at least 8 hours.  I-STAT 7, (LYTES, BLD GAS, ICA, H+H)     Status: Abnormal   Collection Time: 06/22/20  3:19 PM  Result Value Ref Range   pH, Arterial 7.139 (LL) 7.35 - 7.45   pCO2 arterial 73.8 (HH) 32 - 48 mmHg   pO2, Arterial 88 83 - 108 mmHg   Bicarbonate 25.1 20.0 - 28.0 mmol/L   TCO2 27 22 - 32 mmol/L   O2 Saturation 93.0 %   Acid-base deficit 5.0 (H) 0.0 - 2.0 mmol/L   Sodium 142 135 - 145 mmol/L   Potassium 3.6 3.5 - 5.1  mmol/L   Calcium, Ion 1.07 (L) 1.15 - 1.40 mmol/L   HCT 34.0 (L) 36 - 46 %   Hemoglobin 11.6 (L) 12.0 - 15.0 g/dL   Collection site art line    Drawn by RT    Sample type ARTERIAL   Glucose, capillary     Status: Abnormal   Collection Time: 06/22/20  3:31 PM  Result Value Ref Range   Glucose-Capillary 479 (H) 70 - 99 mg/dL    Comment: Glucose reference range applies only to samples taken after fasting for at least 8 hours.  Glucose, capillary     Status: Abnormal   Collection Time: 06/22/20  4:44 PM  Result Value Ref Range   Glucose-Capillary 474 (H) 70 - 99 mg/dL    Comment: Glucose reference range applies only to samples taken after fasting for at least 8 hours.  Glucose, capillary     Status: Abnormal   Collection Time: 06/22/20  5:46 PM  Result Value Ref Range   Glucose-Capillary 430 (H) 70 - 99 mg/dL    Comment: Glucose reference range applies only to samples taken after fasting for at least 8 hours.  I-STAT 7, (LYTES, BLD GAS, ICA, H+H)     Status: Abnormal   Collection Time: 06/22/20  6:08 PM  Result Value Ref Range   pH, Arterial 7.120 (LL) 7.35 - 7.45    pCO2 arterial 78.5 (HH) 32 - 48 mmHg   pO2, Arterial 104 83 - 108 mmHg   Bicarbonate 25.5 20.0 - 28.0 mmol/L   TCO2 28 22 - 32 mmol/L   O2 Saturation 95.0 %   Acid-base deficit 5.0 (H) 0.0 - 2.0 mmol/L   Sodium 142 135 - 145 mmol/L   Potassium 3.6 3.5 - 5.1 mmol/L   Calcium, Ion 1.06 (L) 1.15 - 1.40 mmol/L   HCT 34.0 (L) 36 - 46 %   Hemoglobin 11.6 (L) 12.0 - 15.0 g/dL   Collection site art line    Drawn by RT    Sample type ARTERIAL   Glucose, capillary     Status: Abnormal   Collection Time: 06/22/20  6:14 PM  Result Value Ref Range   Glucose-Capillary 435 (H) 70 - 99 mg/dL    Comment: Glucose reference range applies only to samples taken after fasting for at least 8 hours.  Glucose, capillary     Status: Abnormal   Collection Time: 06/22/20  6:48 PM  Result Value Ref Range   Glucose-Capillary 431 (H) 70 - 99 mg/dL    Comment: Glucose reference range applies only to samples taken after fasting for at least 8 hours.  Glucose, capillary     Status: Abnormal   Collection Time: 06/22/20  7:36 PM  Result Value Ref Range   Glucose-Capillary 353 (H) 70 - 99 mg/dL    Comment: Glucose reference range applies only to samples taken after fasting for at least 8 hours.  Glucose, capillary     Status: Abnormal   Collection Time: 06/22/20  8:11 PM  Result Value Ref Range   Glucose-Capillary 479 (H) 70 - 99 mg/dL    Comment: Glucose reference range applies only to samples taken after fasting for at least 8 hours.  Glucose, capillary     Status: Abnormal   Collection Time: 06/22/20  8:49 PM  Result Value Ref Range   Glucose-Capillary 455 (H) 70 - 99 mg/dL    Comment: Glucose reference range applies only to samples taken after fasting for at least 8 hours.  Glucose, capillary     Status: Abnormal   Collection Time: 06/22/20  9:23 PM  Result Value Ref Range   Glucose-Capillary 401 (H) 70 - 99 mg/dL    Comment: Glucose reference range applies only to samples taken after fasting for at least  8 hours.  Glucose, capillary     Status: Abnormal   Collection Time: 06/22/20  9:55 PM  Result Value Ref Range   Glucose-Capillary 418 (H) 70 - 99 mg/dL    Comment: Glucose reference range applies only to samples taken after fasting for at least 8 hours.  Heparin level (unfractionated)     Status: Abnormal   Collection Time: 06/22/20 10:00 PM  Result Value Ref Range   Heparin Unfractionated 1.80 (H) 0.30 - 0.70 IU/mL    Comment: RESULTS CONFIRMED BY MANUAL DILUTION (NOTE) If heparin results are below expected values, and patient dosage has  been confirmed, suggest follow up testing of antithrombin III levels. Performed at [facility], [place] [address] [place], [place] [address]   Glucose, capillary     Status: Abnormal   Collection Time: 06/22/20 10:27 PM  Result Value Ref Range   Glucose-Capillary 392 (H) 70 - 99 mg/dL    Comment: Glucose reference range applies only to samples taken after fasting for at least 8 hours.  Glucose, capillary     Status: Abnormal   Collection Time: 06/22/20 11:28 PM  Result Value Ref Range   Glucose-Capillary 365 (H) 70 - 99 mg/dL    Comment: Glucose reference range applies only to samples taken after fasting for at least 8 hours.  Glucose, capillary     Status: Abnormal   Collection Time: 06/23/20 12:33 AM  Result Value Ref Range   Glucose-Capillary 380 (H) 70 - 99 mg/dL    Comment: Glucose reference range applies only to samples taken after fasting for at least 8 hours.  Glucose, capillary     Status: Abnormal   Collection Time: 06/23/20  1:43 AM  Result Value Ref Range   Glucose-Capillary 283 (H) 70 - 99 mg/dL    Comment: Glucose reference range applies only to samples taken after fasting for at least 8 hours.  Glucose, capillary     Status: Abnormal   Collection Time: 06/23/20  2:49 AM  Result Value Ref Range   Glucose-Capillary 296 (H) 70 - 99 mg/dL    Comment: Glucose reference range applies only to samples taken after fasting for at  least 8 hours.  Glucose, capillary     Status: Abnormal   Collection Time: 06/23/20  3:34 AM  Result Value Ref Range   Glucose-Capillary 270 (H) 70 - 99 mg/dL    Comment: Glucose reference range applies only to samples taken after fasting for at least 8 hours.  Glucose, capillary     Status: Abnormal   Collection Time: 06/23/20  4:46 AM  Result Value Ref Range   Glucose-Capillary 116 (H) 70 - 99 mg/dL    Comment: Glucose reference range applies only to samples taken after fasting for at least 8 hours.  CBC with Differential/Platelet     Status: Abnormal   Collection Time: 06/23/20  5:00 AM  Result Value Ref Range   WBC 36.2 (H) 4.0 - 10.5 K/uL   RBC 3.73 (L) 3.87 - 5.11 MIL/uL   Hemoglobin 10.6 (L) 12.0 - 15.0 g/dL   HCT 36.2 36 - 46 %   MCV 97.1 80.0 - 100.0 fL   MCH 28.4 26.0 - 34.0 pg  MCHC 29.3 (L) 30.0 - 36.0 g/dL   RDW 14.3 11.5 - 15.5 %   Platelets 307 150 - 400 K/uL   nRBC 1.1 (H) 0.0 - 0.2 %   Neutrophils Relative % 80 %   Neutro Abs 29.1 (H) 1.7 - 7.7 K/uL   Lymphocytes Relative 4 %   Lymphs Abs 1.3 0.7 - 4.0 K/uL   Monocytes Relative 7 %   Monocytes Absolute 2.5 (H) 0 - 1 K/uL   Eosinophils Relative 0 %   Eosinophils Absolute 0.0 0 - 0 K/uL   Basophils Relative 0 %   Basophils Absolute 0.0 0 - 0 K/uL   WBC Morphology MILD LEFT SHIFT (1-5% METAS, OCC MYELO, OCC BANDS)    Immature Granulocytes 9 %   Abs Immature Granulocytes 3.26 (H) 0.00 - 0.07 K/uL    Comment: Performed at [facility] [address] [place], [place] [address]  Comprehensive metabolic panel     Status: Abnormal   Collection Time: 06/23/20  5:00 AM  Result Value Ref Range   Sodium 142 135 - 145 mmol/L   Potassium 3.7 3.5 - 5.1 mmol/L   Chloride 107 98 - 111 mmol/L   CO2 22 22 - 32 mmol/L   Glucose, Bld 275 (H) 70 - 99 mg/dL    Comment: Glucose reference range applies only to samples taken after fasting for at least 8 hours.   BUN 87 (H) 6 - 20 mg/dL   Creatinine, Ser 3.76 (H) 0.44 -  1.00 mg/dL    Comment: DELTA CHECK NOTED   Calcium 7.5 (L) 8.9 - 10.3 mg/dL   Total Protein 4.5 (L) 6.5 - 8.1 g/dL   Albumin 1.9 (L) 3.5 - 5.0 g/dL   AST 25 15 - 41 U/L   ALT 19 0 - 44 U/L   Alkaline Phosphatase 70 38 - 126 U/L   Total Bilirubin 0.7 0.3 - 1.2 mg/dL   GFR calc non Af Amer 14 (L) >60 mL/min   GFR calc Af Amer 16 (L) >60 mL/min   Anion gap 13 5 - 15    Comment: Performed at [facility] [address] [place], [place] [address]  Triglycerides     Status: Abnormal   Collection Time: 06/23/20  5:00 AM  Result Value Ref Range   Triglycerides 816 (H) <150 mg/dL    Comment: Performed at [facility] [address] [place], [place] [address]  Glucose, capillary     Status: Abnormal   Collection Time: 06/23/20  5:51 AM  Result Value Ref Range   Glucose-Capillary 218 (H) 70 - 99 mg/dL    Comment: Glucose reference range applies only to samples taken after fasting for at least 8 hours.  Glucose, capillary     Status: Abnormal   Collection Time: 06/23/20  6:57 AM  Result Value Ref Range   Glucose-Capillary 219 (H) 70 - 99 mg/dL    Comment: Glucose reference range applies only to samples taken after fasting for at least 8 hours.  Glucose, capillary     Status: Abnormal   Collection Time: 06/23/20  7:56 AM  Result Value Ref Range   Glucose-Capillary 216 (H) 70 - 99 mg/dL    Comment: Glucose reference range applies only to samples taken after fasting for at least 8 hours.     ROS:  Patient intubated and sedated  Physical Exam: Vitals:   06/23/20 0735 06/23/20 0745  BP: (!) 97/48   Pulse: Marland Kitchen)  117 (!) 114  Resp: (!) 35 (!) 31  Temp:    SpO2: 92% 93%     General: Adult female lying prone sedated. HEENT: ET tube in place Neck: Supple no thyromegaly Heart: Regular rate and rhythm no murmurs rubs gallops JVP not elevated Lungs: Mechanically supported breath sounds Abdomen: Hypoactive bowel sounds nontender Extremities: Mottled appearance dusky feet  diffuse anasarca Skin: Mottled skin Neuro: Sedated  Assessment/Plan: 1.Acute oliguric renal failure.  There appears to be progression of acute renal failure in the setting of sepsis and pneumonia and hypoxic respiratory failure.  Secondary to COVID-19 infection.  Discussed with critical care team will need to start CRRT therapy. 2. Hypertension/volume  -appears to be volume overloaded.  Positive fluid balance we will try to remove fluid but may be challenging as patient is requiring pressors. 3.  COVID-19 pneumonia.  Therapy as per critical care 4.  Shock broad-spectrum IV steroids and treatment for COVID-19.  IV pressors   Sherril Croon 06/23/2020, 8:54 AM

## 2020-06-23 NOTE — Procedures (Signed)
Central Venous Catheter Insertion Procedure Note  Lisa [name]  [id number]  19-Apr-1976  Date:06/23/20  Time:11:43 AM   Provider Performing:Aidah Forquer [name]   Procedure: Insertion of Non-tunneled Central Venous [phone]) with US guidance ([id number])   Indication(s) Hemodialysis  Consent Risks of the procedure as well as the alternatives and risks of each were explained to the patient and/or caregiver.  Consent for the procedure was obtained and is signed in the bedside chart  Anesthesia see mar  Timeout Verified patient identification, verified procedure, site/side was marked, verified correct patient position, special equipment/implants available, medications/allergies/relevant history reviewed, required imaging and test results available.  Sterile Technique Maximal sterile technique including full sterile barrier drape, hand hygiene, sterile gown, sterile gloves, mask, hair covering, sterile ultrasound probe cover (if used).  Procedure Description Area of catheter insertion was cleaned with chlorhexidine and draped in sterile fashion.  With real-time ultrasound guidance a HD catheter was placed into the right internal jugular vein. Nonpulsatile blood flow and easy flushing noted in all ports.  The catheter was sutured in place and sterile dressing applied.  Complications/Tolerance None; patient tolerated the procedure well. Chest X-ray is ordered to verify placement for internal jugular or subclavian cannulation.   Chest x-ray is not ordered for femoral cannulation.  EBL Minimal  Specimen(s) None   Line was done under u/s guidance. Easily compressible vein noted with neighboring pulsatile artery. Upon stick dark red non pulsatile blood was noted. Wire easily advanced. Wire was verified to be in easily compressible/non pulsatile vessel in both cross sectional and longitudinal views under ultrasound guidance. Skin was easily dilated and catheter advanced without issue.  Wire was withdrawn from vessel. No air was aspirated thru entirety of the procedure. Pt tolerated well. No complications were appreciated.

## 2020-06-23 DEATH — deceased

## 2020-06-24 ENCOUNTER — Inpatient Hospital Stay (HOSPITAL_COMMUNITY): Payer: BC Managed Care – PPO

## 2020-06-24 DIAGNOSIS — N179 Acute kidney failure, unspecified: Secondary | ICD-10-CM

## 2020-06-24 DIAGNOSIS — I82432 Acute embolism and thrombosis of left popliteal vein: Secondary | ICD-10-CM

## 2020-06-24 DIAGNOSIS — I361 Nonrheumatic tricuspid (valve) insufficiency: Secondary | ICD-10-CM

## 2020-06-24 DIAGNOSIS — I82402 Acute embolism and thrombosis of unspecified deep veins of left lower extremity: Secondary | ICD-10-CM

## 2020-06-24 DIAGNOSIS — I998 Other disorder of circulatory system: Secondary | ICD-10-CM

## 2020-06-24 DIAGNOSIS — U071 COVID-19: Secondary | ICD-10-CM

## 2020-06-24 LAB — POCT I-STAT 7, (LYTES, BLD GAS, ICA,H+H)
Acid-base deficit: 2 mmol/L (ref 0.0–2.0)
Bicarbonate: 27.8 mmol/L (ref 20.0–28.0)
Calcium, Ion: 1.06 mmol/L — ABNORMAL LOW (ref 1.15–1.40)
HCT: 29 % — ABNORMAL LOW (ref 36.0–46.0)
Hemoglobin: 9.9 g/dL — ABNORMAL LOW (ref 12.0–15.0)
O2 Saturation: 97 %
Patient temperature: 36.5
Potassium: 5 mmol/L (ref 3.5–5.1)
Sodium: 135 mmol/L (ref 135–145)
TCO2: 30 mmol/L (ref 22–32)
pCO2 arterial: 79.7 mmHg (ref 32.0–48.0)
pH, Arterial: 7.148 — CL (ref 7.350–7.450)
pO2, Arterial: 122 mmHg — ABNORMAL HIGH (ref 83.0–108.0)

## 2020-06-24 LAB — GASTROINTESTINAL PANEL BY PCR, STOOL (REPLACES STOOL CULTURE)

## 2020-06-24 LAB — CBC WITH DIFFERENTIAL/PLATELET
Abs Immature Granulocytes: 5.2 10*3/uL — ABNORMAL HIGH (ref 0.00–0.07)
Basophils Absolute: 0.1 10*3/uL (ref 0.0–0.1)
Basophils Relative: 0 %
Eosinophils Absolute: 0 10*3/uL (ref 0.0–0.5)
Eosinophils Relative: 0 %
HCT: 33.1 % — ABNORMAL LOW (ref 36.0–46.0)
Hemoglobin: 9.8 g/dL — ABNORMAL LOW (ref 12.0–15.0)
Immature Granulocytes: 8 %
Lymphocytes Relative: 1 %
Lymphs Abs: 0.9 10*3/uL (ref 0.7–4.0)
MCH: 27.7 pg (ref 26.0–34.0)
MCHC: 29.6 g/dL — ABNORMAL LOW (ref 30.0–36.0)
MCV: 93.5 fL (ref 80.0–100.0)
Monocytes Absolute: 4.1 10*3/uL — ABNORMAL HIGH (ref 0.1–1.0)
Monocytes Relative: 6 %
Neutro Abs: 58.4 10*3/uL — ABNORMAL HIGH (ref 1.7–7.7)
Neutrophils Relative %: 85 %
Platelets: 342 10*3/uL (ref 150–400)
RBC: 3.54 MIL/uL — ABNORMAL LOW (ref 3.87–5.11)
RDW: 14.1 % (ref 11.5–15.5)
WBC: 68.6 10*3/uL (ref 4.0–10.5)
nRBC: 2.2 % — ABNORMAL HIGH (ref 0.0–0.2)

## 2020-06-24 LAB — RENAL FUNCTION PANEL
Albumin: 1.8 g/dL — ABNORMAL LOW (ref 3.5–5.0)
Anion gap: 11 (ref 5–15)
BUN: 36 mg/dL — ABNORMAL HIGH (ref 6–20)
CO2: 22 mmol/L (ref 22–32)
Calcium: 6.8 mg/dL — ABNORMAL LOW (ref 8.9–10.3)
Chloride: 101 mmol/L (ref 98–111)
Creatinine, Ser: 2.09 mg/dL — ABNORMAL HIGH (ref 0.44–1.00)
GFR calc Af Amer: 33 mL/min — ABNORMAL LOW (ref >60)
GFR calc non Af Amer: 28 mL/min — ABNORMAL LOW (ref >60)
Glucose, Bld: 204 mg/dL — ABNORMAL HIGH (ref 70–99)
Phosphorus: 3.9 mg/dL (ref 2.5–4.6)
Potassium: 4.6 mmol/L (ref 3.5–5.1)
Sodium: 134 mmol/L — ABNORMAL LOW (ref 135–145)

## 2020-06-24 LAB — COMPREHENSIVE METABOLIC PANEL
ALT: 28 U/L (ref 0–44)
AST: 32 U/L (ref 15–41)
Albumin: 2.1 g/dL — ABNORMAL LOW (ref 3.5–5.0)
Alkaline Phosphatase: 93 U/L (ref 38–126)
Anion gap: 9 (ref 5–15)
BUN: 44 mg/dL — ABNORMAL HIGH (ref 6–20)
CO2: 25 mmol/L (ref 22–32)
Calcium: 7.5 mg/dL — ABNORMAL LOW (ref 8.9–10.3)
Chloride: 104 mmol/L (ref 98–111)
Creatinine, Ser: 2.52 mg/dL — ABNORMAL HIGH (ref 0.44–1.00)
GFR calc Af Amer: 26 mL/min — ABNORMAL LOW (ref >60)
GFR calc non Af Amer: 23 mL/min — ABNORMAL LOW (ref >60)
Glucose, Bld: 210 mg/dL — ABNORMAL HIGH (ref 70–99)
Potassium: 4.9 mmol/L (ref 3.5–5.1)
Sodium: 138 mmol/L (ref 135–145)
Total Bilirubin: 0.9 mg/dL (ref 0.3–1.2)
Total Protein: 5.4 g/dL — ABNORMAL LOW (ref 6.5–8.1)

## 2020-06-24 LAB — COOXEMETRY PANEL
Carboxyhemoglobin: 1.1 % (ref 0.5–1.5)
Methemoglobin: 0.6 % (ref 0.0–1.5)
O2 Saturation: 92.4 %
Total hemoglobin: 9.2 g/dL — ABNORMAL LOW (ref 12.0–16.0)

## 2020-06-24 LAB — GLUCOSE, CAPILLARY
Glucose-Capillary: 170 mg/dL — ABNORMAL HIGH (ref 70–99)
Glucose-Capillary: 184 mg/dL — ABNORMAL HIGH (ref 70–99)
Glucose-Capillary: 185 mg/dL — ABNORMAL HIGH (ref 70–99)
Glucose-Capillary: 194 mg/dL — ABNORMAL HIGH (ref 70–99)
Glucose-Capillary: 174 mg/dL — ABNORMAL HIGH (ref 70–99)
Glucose-Capillary: 213 mg/dL — ABNORMAL HIGH (ref 70–99)
Glucose-Capillary: 133 mg/dL — ABNORMAL HIGH (ref 70–99)

## 2020-06-24 LAB — TRIGLYCERIDES: Triglycerides: 630 mg/dL — ABNORMAL HIGH (ref <150)

## 2020-06-24 LAB — ECHOCARDIOGRAM LIMITED
Area-P 1/2: 3.06 cm2
Height: 63.75 in
S' Lateral: 1.8 cm
Weight: 3350.99 oz

## 2020-06-24 LAB — MAGNESIUM: Magnesium: 2.7 mg/dL — ABNORMAL HIGH (ref 1.7–2.4)

## 2020-06-24 LAB — MRSA PCR SCREENING: MRSA by PCR: NEGATIVE

## 2020-06-24 LAB — HEPARIN LEVEL (UNFRACTIONATED)
Heparin Unfractionated: 0.48 IU/mL (ref 0.30–0.70)
Heparin Unfractionated: 0.33 IU/mL (ref 0.30–0.70)
Heparin Unfractionated: 0.21 IU/mL — ABNORMAL LOW (ref 0.30–0.70)

## 2020-06-24 LAB — C DIFFICILE (CDIFF) QUICK SCRN (NO PCR REFLEX)
C Diff antigen: NEGATIVE
C Diff interpretation: NOT DETECTED
C Diff toxin: NEGATIVE

## 2020-06-24 LAB — TROPONIN I (HIGH SENSITIVITY): Troponin I (High Sensitivity): 600 ng/L (ref <18)

## 2020-06-24 LAB — FERRITIN: Ferritin: 1081 ng/mL — ABNORMAL HIGH (ref 11–307)

## 2020-06-24 LAB — C-REACTIVE PROTEIN: CRP: 20.2 mg/dL — ABNORMAL HIGH (ref <1.0)

## 2020-06-24 LAB — PHOSPHORUS: Phosphorus: 4.7 mg/dL — ABNORMAL HIGH (ref 2.5–4.6)

## 2020-06-24 MED ORDER — VANCOMYCIN HCL IN DEXTROSE 1-5 GM/200ML-% IV SOLN
1000.0000 mg | INTRAVENOUS | Status: DC
  Administered 2020-06-25: 1000 mg via INTRAVENOUS
  Filled 2020-06-24: qty 200

## 2020-06-24 MED ORDER — INSULIN DETEMIR 100 UNIT/ML ~~LOC~~ SOLN
33.0000 [IU] | Freq: Two times a day (BID) | SUBCUTANEOUS | Status: DC
  Administered 2020-06-24 (×2): 33 [IU] via SUBCUTANEOUS
  Filled 2020-06-24 (×4): qty 0.33

## 2020-06-24 MED ORDER — VANCOMYCIN HCL 2000 MG/400ML IV SOLN
2000.0000 mg | Freq: Once | INTRAVENOUS | Status: AC
  Administered 2020-06-24: 2000 mg via INTRAVENOUS
  Filled 2020-06-24: qty 400

## 2020-06-24 MED ORDER — SODIUM CHLORIDE 0.9 % IV SOLN: 2.0000 g | INTRAVENOUS | Status: DC

## 2020-06-24 MED ORDER — VANCOMYCIN HCL 10 G IV SOLR
2000.0000 mg | Freq: Once | INTRAVENOUS | Status: DC
  Filled 2020-06-24: qty 2000

## 2020-06-24 MED ORDER — SODIUM CHLORIDE 0.9 % IV SOLN
2.0000 g | Freq: Once | INTRAVENOUS | Status: AC
  Administered 2020-06-24: 2 g via INTRAVENOUS
  Filled 2020-06-24: qty 2

## 2020-06-24 NOTE — Progress Notes (Signed)
Turned Pt's head to the right. Suctioned airway and mouth. No complications noted.

## 2020-06-24 NOTE — Progress Notes (Signed)
Turned pt's head to the left with no complications. Suctioned airway and mouth.

## 2020-06-24 NOTE — Progress Notes (Signed)
[place] for Heparin  Indication: DVT  Allergies  Allergen Reactions  . Effexor [Venlafaxine Hcl] Other (See Comments)    Pt becomes suicidal  . Percocet [Oxycodone-Acetaminophen] Nausea And Vomiting  . Vicodin [Hydrocodone-Acetaminophen] Nausea And Vomiting    Patient Measurements: Height: 5' 3.75" (161.9 cm) Weight: 95 kg (209 lb 7 oz) IBW/kg (Calculated) : 54.13 Heparin Dosing Weight: 74.9kg  Vital Signs: Temp: 97.7 F (36.5 C) (09/02 1124) Temp Source: Rectal (09/02 0801) BP: 103/54 (09/02 0820) Pulse Rate: 118 (09/02 1130)  Labs: Recent Labs    06/22/20 0309 06/22/20 1043 06/22/20 2200 06/23/20 0500 06/23/20 0900 06/23/20 1154 06/23/20 1301 06/23/20 1617 06/23/20 1617 06/23/20 1635 06/23/20 1705 06/24/20 0054 06/24/20 0446 06/24/20 1009 06/24/20 1115  HGB 12.3   < >  --  10.6*  --    < >  --  10.2*   < >  --   --   --  9.8*  --  9.9*  HCT 42.9   < >  --  36.2  --    < >  --  30.0*  --   --   --   --  33.1*  --  29.0*  PLT 232  --   --  307  --   --   --   --   --   --   --   --  342  --   --   APTT  --   --   --   --  50*  --   --   --   --   --   --   --   --   --   --   HEPARINUNFRC  --    < >   < >  --  1.00*   < >   < >  --   --   --  0.62 0.48  --  0.33  --   CREATININE 2.45*  --   --  3.76*  --   --   --   --   --  3.95*  --   --  2.52*  --   --    < > = values in this interval not displayed.    Estimated Creatinine Clearance: 32.4 mL/min (A) (by C-G formula based on SCr of 2.52 mg/dL (H)).  Assessment: 44 y.o. female with DVT for heparin.   Initially required Heparin to be held and large rate reductions due to high heparin levels. APTT checked and only 50. Once level was down to normal range, resumed at low rate.   Heparin level remains therapeutic at 0.33 on low rate of 200 units/hr -- but trending down. Will increase slightly and repeat level this PM as on CRRT.  Goal of Therapy:  Heparin level 0.3-0.7  units/ml Monitor platelets by anticoagulation protocol: Yes   Plan:  Increase Heparin to 250 units/hr.  Recheck Heparin level in 8 hours to confirm.  Daily Heparin level and CBC while on therapy.   Sloan Leiter, PharmD, BCPS, BCCCP Clinical Pharmacist Please refer to [facility] for [place] numbers 06/24/2020, 12:13 PM

## 2020-06-24 NOTE — Progress Notes (Signed)
  Echocardiogram 2D Echocardiogram has been performed.  Lisa [name] 06/24/2020, 3:18 PM

## 2020-06-24 NOTE — Progress Notes (Addendum)
[place] for Heparin  Indication: DVT  Allergies  Allergen Reactions  . Effexor [Venlafaxine Hcl] Other (See Comments)    Pt becomes suicidal  . Percocet [Oxycodone-Acetaminophen] Nausea And Vomiting  . Vicodin [Hydrocodone-Acetaminophen] Nausea And Vomiting    Patient Measurements: Height: 5' 3.75" (161.9 cm) Weight: 95 kg (209 lb 7 oz) IBW/kg (Calculated) : 54.13 Heparin Dosing Weight: 74.9kg  Vital Signs: Temp: 96.8 F (36 C) (09/02 2000) Temp Source: Rectal (09/02 2000) Pulse Rate: 107 (09/02 2000)  Labs: Recent Labs    06/22/20 0309 06/22/20 1043 06/22/20 2200 06/23/20 0500 06/23/20 0500 06/23/20 0900 06/23/20 1154 06/23/20 1617 06/23/20 1617 06/23/20 1635 06/23/20 1705 06/24/20 0054 06/24/20 0446 06/24/20 1009 06/24/20 1115 06/24/20 1409 06/24/20 1600 06/24/20 2037  HGB 12.3   < >  --  10.6*  --   --    < > 10.2*   < >  --   --   --  9.8*  --  9.9*  --   --   --   HCT 42.9   < >  --  36.2  --   --    < > 30.0*  --   --   --   --  33.1*  --  29.0*  --   --   --   PLT 232  --   --  307  --   --   --   --   --   --   --   --  342  --   --   --   --   --   APTT  --   --   --   --   --  50*  --   --   --   --   --   --   --   --   --   --   --   --   HEPARINUNFRC  --    < >   < >  --   --  1.00*   < >  --   --   --    < > 0.48  --  0.33  --   --   --  0.21*  CREATININE 2.45*  --   --  3.76*   < >  --   --   --   --  3.95*  --   --  2.52*  --   --   --  2.09*  --   TROPONINIHS  --   --   --   --   --   --   --   --   --   --   --   --   --   --   --  600*  --   --    < > = values in this interval not displayed.    Estimated Creatinine Clearance: 39 mL/min (A) (by C-G formula based on SCr of 2.09 mg/dL (H)).  Assessment: 44 y.o. female with DVT for heparin.   Initially required Heparin to be held and large rate reductions due to high heparin levels. APTT checked and only 50. Once level was down to normal range, resumed at  low rate.   Heparin level remains therapeutic at 0.33 on low rate of 200 units/hr -- but trending down. Will increase slightly and repeat level this PM as on CRRT.  Heparin level has been trending down and it came back subtherapeutic tonight  at 0.21. We will bump the rate and check level in AM.   Goal of Therapy:  Heparin level 0.3-0.5 Monitor platelets by anticoagulation protocol: Yes   Plan:  Increase Heparin to 350 units/hr Daily Heparin level and CBC while on therapy.   Onnie Boer, PharmD, BCIDP, AAHIVP, CPP Infectious Disease Pharmacist 06/24/2020 10:00 PM

## 2020-06-24 NOTE — Progress Notes (Signed)
RT NOTES: Assisted nursing with placing patient back in supine position. Placed ETT holder, tube secured at 23 cm at the lip.

## 2020-06-24 NOTE — Progress Notes (Signed)
[facility] ROUNDING NOTE   Subjective:   Brief history: This is  a 44 y.o. female.  She was admitted on 06/22/2020 with Covid pneumonia.  She developed acute hypoxic respiratory failure secondary to ARDS and Covid 19 pneumonia and sepsis.  She required mechanical ventilation.  Her baseline serum creatinine on 06/20/2020 was 0.89 mg/dL on 06/21/2020 increased to 1.4 mg deciliter 06/22/2020 2.4 mg deciliter 06/23/2020 3.76 mg/dL.  Urine output has been dwindling.  06/22/2000 421 cc.  She is adequately volume resuscitated and positive balance about 5 L.  CRRT was initiated 06/23/2020 in consultation with critical care medicine  Blood pressure 103/56 pulse 118 temperature 96.8 O2 sats 99% FiO2 80%  Ultrafiltration rate.  Patient positive fluid balance 3.3 L 06/23/2020  Sodium 138 potassium 4.9 chloride 104 CO2 25 BUN 44 creatinine 2.52 glucose 210 calcium 7.5 phosphorus 4.7 magnesium 2.7 hemoglobin 10.2  IV heparin IV norepinephrine    Objective:  Vital signs in last 24 hours:  Temp:  [95 F (35 C)-99 F (37.2 C)] 96.8 F (36 C) (09/02 0700) Pulse Rate:  [114-143] 118 (09/02 0700) Resp:  [27-35] 35 (09/02 0700) BP: (85-105)/(48-52) 98/52 (09/02 0224) SpO2:  [80 %-99 %] 99 % (09/02 0700) Arterial Line BP: (84-150)/(6-66) 103/56 (09/02 0700) FiO2 (%):  [70 %-100 %] 80 % (09/02 0400) Weight:  [95 kg] 95 kg (09/02 0500)  Weight change: 0 kg Filed Weights   06/22/20 0500 06/23/20 0500 06/24/20 0500  Weight: 94.1 kg 95 kg 95 kg    Intake/Output: I/O last 3 completed shifts: In: 4713.2 [I.V.:2840.8; NG/GT:1772.4; IV Piggyback:100] Out: 2470 [Urine:35; ZOXWR:6045; Stool:200]   Intake/Output this shift:  No intake/output data recorded.  Alert female   lying prone sedated CVS- RRR no murmurs rubs gallops no JVD RS-mechanically supported breath sounds ABD-hypoactive EXT-mottled with diffuse anasarca   Basic Metabolic Panel: Recent Labs  Lab 06/20/20 0404 06/20/20 1137  06/20/20 1708 06/20/20 2000 06/21/20 0557 06/21/20 1120 06/21/20 1709 06/21/20 1950 06/22/20 0309 06/22/20 1043 06/23/20 0500 06/23/20 1154 06/23/20 1617 06/23/20 1635 06/24/20 0446  NA   < >  --    < >  --  147*   < >  --    < > 143   < > 142 140 141 141 138  K   < >  --    < >  --  4.5   < >  --    < > 4.6   < > 3.7 4.1 4.0 4.2 4.9  CL   < >  --   --   --  111  --   --   --  109  --  107  --   --  107 104  CO2   < >  --   --   --  27  --   --   --  23  --  22  --   --  23 25  GLUCOSE   < >  --   --   --  388*  --   --   --  460*  --  275*  --   --  145* 210*  BUN   < >  --   --   --  42*  --   --   --  63*  --  87*  --   --  81* 44*  CREATININE   < >  --   --   --  1.42*  --   --   --  2.45*  --  3.76*  --   --  3.95* 2.52*  CALCIUM   < >  --   --   --  7.6*  --   --   --  7.5*  --  7.5*  --   --  7.4* 7.5*  MG  --  2.6*  --  2.8* 3.1*  --  3.1*  --   --   --   --   --   --   --  2.7*  PHOS   < > 4.1  --  5.3* 5.1*  --  4.8*  --  5.8*  --   --   --   --  5.5* 4.7*   < > = values in this interval not displayed.    Liver Function Tests: Recent Labs  Lab 06/20/20 0404 06/20/20 0404 06/21/20 0557 06/22/20 0309 06/23/20 0500 06/23/20 1635 06/24/20 0446  AST 27  --  16 28 25   --  32  ALT 20  --  16 16 19   --  28  ALKPHOS 79  --  75 58 70  --  93  BILITOT 0.2*  --  0.5 0.3 0.7  --  0.9  PROT 5.6*  --  5.3* 4.9* 4.5*  --  5.4*  ALBUMIN 2.0*   < > 2.0* 2.0* 1.9* 2.0* 2.1*   < > = values in this interval not displayed.   No results for input(s): LIPASE, AMYLASE in the last 168 hours. No results for input(s): AMMONIA in the last 168 hours.  CBC: Recent Labs  Lab 06/20/20 0404 06/20/20 0628 06/21/20 0402 06/21/20 1120 06/22/20 0309 06/22/20 1043 06/22/20 1808 06/23/20 0500 06/23/20 1154 06/23/20 1617 06/24/20 0446  WBC 17.5*  --  13.6*  --  16.4*  --   --  36.2*  --   --  68.6*  NEUTROABS 15.1*  --  10.9*  --  13.1*  --   --  29.1*  --   --  58.4*  HGB 13.2   < >  10.7*   < > 12.3   < > 11.6* 10.6* 10.9* 10.2* 9.8*  HCT 45.6   < > 37.8   < > 42.9   < > 34.0* 36.2 32.0* 30.0* 33.1*  MCV 94.0  --  100.0  --  98.6  --   --  97.1  --   --  93.5  PLT 131*  --  149*  --  232  --   --  307  --   --  342   < > = values in this interval not displayed.    Cardiac Enzymes: No results for input(s): CKTOTAL, CKMB, CKMBINDEX, TROPONINI in the last 168 hours.  BNP: Invalid input(s): POCBNP  CBG: Recent Labs  Lab 06/23/20 1542 06/23/20 1614 06/23/20 1954 06/23/20 2309 06/24/20 0313  GLUCAP 136* 131* 183* 199* 170*    Microbiology: Results for orders placed or performed during the hospital encounter of 05/27/2020  Blood Culture (routine x 2)     Status: None   Collection Time: 06/14/2020 10:30 PM   Specimen: BLOOD  Result Value Ref Range Status   Specimen Description BLOOD RIGHT ARM  Final   Special Requests   Final    BOTTLES DRAWN AEROBIC AND ANAEROBIC Blood Culture adequate volume   Culture   Final    NO GROWTH 5 DAYS Performed at [facility], [place] [address], [place], [place] [address]  Report Status 06/22/2020 FINAL  Final  Blood Culture (routine x 2)     Status: None   Collection Time: 06/03/2020 10:50 PM   Specimen: BLOOD  Result Value Ref Range Status   Specimen Description BLOOD LEFT ARM  Final   Special Requests   Final    BOTTLES DRAWN AEROBIC ONLY Blood Culture adequate volume   Culture   Final    NO GROWTH 5 DAYS Performed at [facility], [address] [place], [place] [address]    Report Status 06/22/2020 FINAL  Final  SARS Coronavirus 2 by RT PCR (hospital order, performed in [facility] hospital lab) Nasopharyngeal Nasopharyngeal Swab     Status: Abnormal   Collection Time: 06/17/20  2:19 AM   Specimen: Nasopharyngeal Swab  Result Value Ref Range Status   SARS Coronavirus 2 POSITIVE (A) NEGATIVE Final    Comment: RESULT CALLED TO, READ BACK BY AND VERIFIED WITH: L HARRISON RN 06/17/20 0330  JDW (NOTE) SARS-CoV-2 target nucleic acids are DETECTED  SARS-CoV-2 RNA is generally detectable in upper respiratory specimens  during the acute phase of infection.  Positive results are indicative  of the presence of the identified virus, but do not rule out bacterial infection or co-infection with other pathogens not detected by the test.  Clinical correlation with patient history and  other diagnostic information is necessary to determine patient infection status.  The expected result is negative.  Fact Sheet for Patients:   StrictlyIdeas.no   Fact Sheet for Healthcare Providers:   BankingDealers.co.za    This test is not yet approved or cleared by the [place] FDA and  has been authorized for detection and/or diagnosis of SARS-CoV-2 by FDA under an Emergency Use Authorization (EUA).  This EUA will remain in effect (meaning this test c an be used) for the duration of  the COVID-19 declaration under Section 564(b)(1) of the Act, 21 U.S.C. section 360-bbb-3(b)(1), unless the authorization is terminated or revoked sooner.  Performed at [facility], [place] [address] [place], [place] [address]   MRSA PCR Screening     Status: Abnormal   Collection Time: 06/23/20  6:30 PM   Specimen: Nasal Mucosa; Nasopharyngeal  Result Value Ref Range Status   MRSA by PCR (A) NEGATIVE Final    INVALID, UNABLE TO DETERMINE THE PRESENCE OF TARGET DUE TO SPECIMEN INTEGRITY. RECOLLECTION REQUESTED.    Comment: RESULT CALLED TO, READ BACK BY AND VERIFIED WITH: Olen Pel RN 06/24/20 0210 JDW Performed at [facility], [address] [place], [place] [address]     Coagulation Studies: No results for input(s): LABPROT, INR in the last 72 hours.  Urinalysis: No results for input(s): COLORURINE, LABSPEC, PHURINE, GLUCOSEU, HGBUR, BILIRUBINUR, KETONESUR, PROTEINUR, UROBILINOGEN, NITRITE, LEUKOCYTESUR in the last 72 hours.  Invalid input(s):  APPERANCEUR    Imaging: DG CHEST PORT 1 VIEW  Result Date: 06/23/2020 CLINICAL DATA:  Central line placement. EXAM: PORTABLE CHEST 1 VIEW COMPARISON:  June 20, 2020. FINDINGS: The heart size and mediastinal contours are within normal limits. Endotracheal and nasogastric tubes appear to be in good position. Stable position of right-sided PICC line. Interval placement of right internal jugular catheter with distal tip in expected position of the SVC. Stable bilateral diffuse lung opacities are noted consistent with multifocal pneumonia. No pneumothorax or pleural effusion is noted. The visualized skeletal structures are unremarkable. IMPRESSION: Interval placement of right internal jugular catheter with distal tip in expected position of the SVC. Stable  bilateral diffuse lung opacities consistent with multifocal pneumonia. Electronically Signed   By: Marijo Conception M.D.   On: 06/23/2020 11:55     Medications:   .  prismasol BGK 4/2.5 500 mL/hr at 06/24/20 0100  .  prismasol BGK 4/2.5 500 mL/hr at 06/24/20 0100  . sodium chloride Stopped (06/19/20 0215)  . sodium chloride    . cisatracurium (NIMBEX) infusion 2 mcg/kg/min (06/24/20 0700)  . dextrose    . famotidine (PEPCID) IV Stopped (06/23/20 2208)  . feeding supplement (VITAL AF 1.2 CAL) 1,000 mL (06/24/20 0100)  . fentaNYL infusion INTRAVENOUS 225 mcg/hr (06/24/20 0700)  . heparin 200 Units/hr (06/24/20 0700)  . insulin Stopped (06/23/20 1612)  . midazolam 2 mg/hr (06/24/20 0700)  . norepinephrine (LEVOPHED) Adult infusion 19 mcg/min (06/24/20 0700)  . prismasol BGK 4/2.5 2,000 mL/hr at 06/24/20 0100  . sodium chloride Stopped (06/20/20 0910)   . artificial tears  1 application Both Eyes J1O  . vitamin C  500 mg Per Tube Daily  . baricitinib  2 mg Per Tube Daily  . budesonide (PULMICORT) nebulizer solution  0.5 mg Nebulization BID  . chlorhexidine gluconate (MEDLINE KIT)  15 mL Mouth Rinse BID  . Chlorhexidine Gluconate Cloth  6  each Topical Daily  . docusate  100 mg Per Tube BID  . feeding supplement (PROSource TF)  45 mL Per Tube QID  . insulin aspart  10 Units Subcutaneous Q4H  . insulin aspart  3-9 Units Subcutaneous Q4H  . insulin detemir  31 Units Subcutaneous Q12H  . linagliptin  5 mg Per Tube Daily  . mouth rinse  15 mL Mouth Rinse 10 times per day  . methylPREDNISolone (SOLU-MEDROL) injection  0.5 mg/kg Intravenous BID  . montelukast  10 mg Per Tube QHS  . polyethylene glycol  17 g Per Tube Daily  . pregabalin  100 mg Per Tube Daily  . sodium chloride flush  10-40 mL Intracatheter Q12H  . vitamin B-12  1,000 mcg Per Tube Daily  . zinc sulfate  220 mg Per Tube Daily   Place/Maintain arterial line **AND** sodium chloride, acetaminophen **OR** acetaminophen, clonazePAM, dextrose, dextrose, diphenhydrAMINE, docusate, fentaNYL, guaiFENesin-dextromethorphan, heparin, ipratropium-albuterol, midazolam, ondansetron **OR** ondansetron (ZOFRAN) IV, polyethylene glycol, sodium chloride, sodium chloride flush, vecuronium  Assessment/ Plan:  1.Acute oliguric renal failure.  There appears to be progression of acute renal failure in the setting of sepsis and pneumonia and hypoxic respiratory failure.  Secondary to COVID-19 infection.  CRRT initiated 06/23/2020.  4/2.5 bags no change to prescription. 2. Hypertension/volume  -appears to be volume overloaded.  Positive fluid balance we will try to remove fluid but may be challenging as patient is requiring pressors.  Patient has been in positive fluid balance 06/23/2020 due to worsening clinical status 3.  COVID-19 pneumonia.  Therapy as per critical care 4.  Shock broad-spectrum IV steroids and treatment for COVID-19.  IV pressors    LOS: 7 Sherril Croon @TODAY @7 :33 AM

## 2020-06-24 NOTE — Progress Notes (Signed)
[facility] PCCM Evening Rounds  Echo> normal LV (hyperdynamic), RV severely reduced  Continue heparin Check COOX, if depressed start dobutamine If acutely worsening shock would give TPA for possible PE Monitor overnight Vascular surgery to see, I do not feel she is stable enough for OR right now but may be stable tomorrow  Roselie Awkward, MD [facility] Pager: [phone] Cell: [phone] If no response, call [phone]

## 2020-06-24 NOTE — Progress Notes (Signed)
[place] for Heparin  Indication: DVT  Allergies  Allergen Reactions  . Effexor [Venlafaxine Hcl] Other (See Comments)    Pt becomes suicidal  . Percocet [Oxycodone-Acetaminophen] Nausea And Vomiting  . Vicodin [Hydrocodone-Acetaminophen] Nausea And Vomiting    Patient Measurements: Height: 5' 3.75" (161.9 cm) Weight: 95 kg (209 lb 7 oz) IBW/kg (Calculated) : 54.13 Heparin Dosing Weight: 74.9kg  Vital Signs: Temp: 99 F (37.2 C) (09/02 0000) Temp Source: Axillary (09/02 0000) BP: 105/50 (09/01 1442) Pulse Rate: 140 (09/02 0100)  Labs: Recent Labs    06/21/20 0402 06/21/20 0557 06/22/20 0309 06/22/20 1043 06/22/20 2200 06/23/20 0500 06/23/20 0500 06/23/20 0900 06/23/20 0900 06/23/20 1154 06/23/20 1301 06/23/20 1617 06/23/20 1635 06/23/20 1705 06/24/20 0054  HGB 10.7*   < > 12.3   < >  --  10.6*   < >  --   --  10.9*  --  10.2*  --   --   --   HCT 37.8   < > 42.9   < >  --  36.2  --   --   --  32.0*  --  30.0*  --   --   --   PLT 149*  --  232  --   --  307  --   --   --   --   --   --   --   --   --   APTT  --   --   --   --   --   --   --  50*  --   --   --   --   --   --   --   HEPARINUNFRC  --    < >  --    < >   < >  --   --  1.00*   < >  --  0.89*  --   --  0.62 0.48  CREATININE  --    < > 2.45*  --   --  3.76*  --   --   --   --   --   --  3.95*  --   --    < > = values in this interval not displayed.    Estimated Creatinine Clearance: 20.6 mL/min (A) (by C-G formula based on SCr of 3.95 mg/dL (H)).  Assessment: 44 y.o. female with DVT for heparin  Goal of Therapy:  Heparin level 0.3-0.7 units/ml Monitor platelets by anticoagulation protocol: Yes   Plan:  Continue Heparin at current rate for now Check heparin level in 8 hours. Phillis Knack, PharmD, BCPS

## 2020-06-24 NOTE — Progress Notes (Signed)
NAME:  Lisa [name], MRN:  [id number], DOB:  1976/07/31, LOS: 7 ADMISSION DATE:  06/03/2020, CONSULTATION DATE: 06/17/2020 REFERRING MD: Dr. Aleene Davidson, CHIEF COMPLAINT: COVID-19 pneumonia  Brief History   44 year old female, with PMH of asthma, BMI 30 who presented to [facility] on 8/25 with acute hypoxic respiratory failure in the setting of bilateral infiltrates, COVID-19 positive.  She failed escalating O2 delivery and ultimately was intubated on 8/29.  She developed MSOF requiring initiation of CVVHD on 9/1, paralytics for ventilator synchrony.   Past Medical History  Asthma  Eczema Fibromyalgia Seasonal Allergies  Significant Hospital Events   8/25 Admit  8/26 PCCM consulted / ICU admission 8/31 No further paralytics required. Remains proned on 70%. New le dvt noted and started on heparin gtt overnight 9/01 Minimal improvement with proning overnight with oxygen but still acidotic. On pressors, paralytics, CVVHD initiated.  Consults:  PCCM Nephrology 9/1  Procedures:  ETT 8/29 >> R IJ HD 9/1 >>  Significant Diagnostic Tests:   8/30 LE Doppler >> negative on right, positive acute DVT in L posterior tibial veins, left peroneal veins     Micro Data:  COVID-19 PCR 8/26 >> Positive BCx2 8/25 >> negative  Tracheal Aspirate 8/26 >> not sent MRSA PCR 9/2 >>  Tracheal Aspirate 9/2 >>  BCx2 9/2 >>  GI PCR 9/2 >>   Antimicrobials:  Remdesivir 8/25-8/30 Baricitinib 8/26 >> Cefepime 9/2 >>  Vanco 9/2 >>   Interim history/subjective:  Afebrile, WBC 68.6 On CVVHD  Vent - 80% FiO2, 18 PEEP, Peak 35, Pplat 33, driving pressure 15 On 18 mcg levophed, fentanyl 239mcg, versed 2mg , nimbex gtt Remains prone  Glucose range 170-210 RN reports small amt bleeding from nose, around foley & flexi-seal  Objective   Blood pressure (!) 98/52, pulse (!) 118, temperature (!) 96.8 F (36 C), temperature source Rectal, resp. rate (!) 35, height 5' 3.75" (1.619 m), weight 95 kg, SpO2 99 %.      Body mass index is 36.23 kg/m.   Vent Mode: PRVC FiO2 (%):  [70 %-100 %] 80 % Set Rate:  [35 bmp] 35 bmp Vt Set:  [420 mL] 420 mL PEEP:  [18 cmH20] 18 cmH20 Plateau Pressure:  [30 cmH20-34 cmH20] 32 cmH20   Intake/Output Summary (Last 24 hours) at 06/24/2020 0800 Last data filed at 06/24/2020 0700 Gross per 24 hour  Intake 3161.97 ml  Output 2470 ml  Net 691.97 ml   Filed Weights   06/22/20 0500 06/23/20 0500 06/24/20 0500  Weight: 94.1 kg 95 kg 95 kg   Physical Exam: General: critically ill appearing adult female lying in bed prone on vent HEENT: MM pink/moist, ETT, NGT, dried bloody secretions on nose Neuro: sedate / paralyzed  CV: s1s2 RRR, no m/r/g PULM: synchronous, non-labored, lungs bilaterally coarse  GI: soft, bsx4 active, bruising noted to left lower side / flank, soft Extremities: warm/dry, diffuse anasarca, bilateral feet with mottling / purple discoloration Skin: no rashes or lesions   Resolved Hospital Problem list     Assessment & Plan:   Acute hypoxemic respiratory failure / ARDS secondary to COVID-19 pneumonia Sepsis secondary to COVID-19  Completed CAP coverage 8/29  -low Vt ventilation 4-8cc/kg -goal plateau pressure <30, driving pressure <71 cm H2O -target PaO2 55-65, titrate PEEP/FiO2 per ARDS protocol  -if P/F ratio <150, consider prone therapy for 16 hours per day -goal CVP <4, diuresis as necessary -VAP prevention measures  -follow intermittent CXR  -continue solumedrol 40 mg BID  -continue baricitinib  -  follow inflammatory markers   Asthma  -solumedrol as above  -pulmicort BID  -duoneb Q4   Profound Leukocytosis  Mild left shift on CBC noted  -consider assessment for C-Diff -resume empiric abx for possible sepsis  -re-culture -send GI PCR -assess MRSA PCR   Oliguric AKI -appreciate Nephrology assistance with patient care -Trend BMP / urinary output -Replace electrolytes as indicated -Avoid nephrotoxic agents, ensure adequate  renal perfusion  Sedation Needs while on Mechanical Ventilation  Anxiety -PAD protocol for RASS goal -4 to -5  Anemia  -follow CBC  -monitor left flank bruising  -transfuse for Hgb <7% or active bleeding   LLE DVT Intermittent issues with nose bleed, bloody urine  -heparin gtt per pharmacy   Hyperglycemia -SSI, resistant scale  -TF coverage 10 units Q4 -increase levemir to 33 units Q12 -continue linagliptin   Goals of care -DNR in the event of arrest, established 9/1   Best practice:  Diet: TF Pain/Anxiety/Delirium protocol (if indicated): PAD protocol VAP protocol (if indicated): Yes DVT prophylaxis: heparin gtt  GI prophylaxis: H2 blocker Glucose control: SSI Mobility: bed rest Code Status: Full code Family Communication:  Husband updated via phone 9/2 Disposition: ICU  Labs   CBC: Recent Labs  Lab 06/20/20 0404 06/20/20 0628 06/21/20 0402 06/21/20 1120 06/22/20 0309 06/22/20 1043 06/22/20 1808 06/23/20 0500 06/23/20 1154 06/23/20 1617 06/24/20 0446  WBC 17.5*  --  13.6*  --  16.4*  --   --  36.2*  --   --  68.6*  NEUTROABS 15.1*  --  10.9*  --  13.1*  --   --  29.1*  --   --  58.4*  HGB 13.2   < > 10.7*   < > 12.3   < > 11.6* 10.6* 10.9* 10.2* 9.8*  HCT 45.6   < > 37.8   < > 42.9   < > 34.0* 36.2 32.0* 30.0* 33.1*  MCV 94.0  --  100.0  --  98.6  --   --  97.1  --   --  93.5  PLT 131*  --  149*  --  232  --   --  307  --   --  342   < > = values in this interval not displayed.    Basic Metabolic Panel: Recent Labs  Lab 06/20/20 0404 06/20/20 1137 06/20/20 1708 06/20/20 2000 06/21/20 0557 06/21/20 1120 06/21/20 1709 06/21/20 1950 06/22/20 0309 06/22/20 1043 06/23/20 0500 06/23/20 1154 06/23/20 1617 06/23/20 1635 06/24/20 0446  NA   < >  --    < >  --  147*   < >  --    < > 143   < > 142 140 141 141 138  K   < >  --    < >  --  4.5   < >  --    < > 4.6   < > 3.7 4.1 4.0 4.2 4.9  CL   < >  --   --   --  111  --   --   --  109  --  107  --    --  107 104  CO2   < >  --   --   --  27  --   --   --  23  --  22  --   --  23 25  GLUCOSE   < >  --   --   --  388*  --   --   --  460*  --  275*  --   --  145* 210*  BUN   < >  --   --   --  42*  --   --   --  63*  --  87*  --   --  81* 44*  CREATININE   < >  --   --   --  1.42*  --   --   --  2.45*  --  3.76*  --   --  3.95* 2.52*  CALCIUM   < >  --   --   --  7.6*  --   --   --  7.5*  --  7.5*  --   --  7.4* 7.5*  MG  --  2.6*  --  2.8* 3.1*  --  3.1*  --   --   --   --   --   --   --  2.7*  PHOS   < > 4.1  --  5.3* 5.1*  --  4.8*  --  5.8*  --   --   --   --  5.5* 4.7*   < > = values in this interval not displayed.   GFR: Estimated Creatinine Clearance: 32.4 mL/min (A) (by C-G formula based on SCr of 2.52 mg/dL (H)). Recent Labs  Lab 06/21/20 0402 06/22/20 0309 06/23/20 0500 06/24/20 0446  WBC 13.6* 16.4* 36.2* 68.6*    Liver Function Tests: Recent Labs  Lab 06/20/20 0404 06/20/20 0404 06/21/20 0557 06/22/20 0309 06/23/20 0500 06/23/20 1635 06/24/20 0446  AST 27  --  16 28 25   --  32  ALT 20  --  16 16 19   --  28  ALKPHOS 79  --  75 58 70  --  93  BILITOT 0.2*  --  0.5 0.3 0.7  --  0.9  PROT 5.6*  --  5.3* 4.9* 4.5*  --  5.4*  ALBUMIN 2.0*   < > 2.0* 2.0* 1.9* 2.0* 2.1*   < > = values in this interval not displayed.   No results for input(s): LIPASE, AMYLASE in the last 168 hours. No results for input(s): AMMONIA in the last 168 hours.  ABG    Component Value Date/Time   PHART 7.068 (LL) 06/23/2020 1617   PCO2ART 77.3 (HH) 06/23/2020 1617   PO2ART 68 (L) 06/23/2020 1617   HCO3 22.3 06/23/2020 1617   TCO2 25 06/23/2020 1617   ACIDBASEDEF 9.0 (H) 06/23/2020 1617   O2SAT 83.0 06/23/2020 1617     Coagulation Profile: No results for input(s): INR, PROTIME in the last 168 hours.  Cardiac Enzymes: No results for input(s): CKTOTAL, CKMB, CKMBINDEX, TROPONINI in the last 168 hours.  HbA1C: Hgb A1c MFr Bld  Date/Time Value Ref Range Status  06/18/2020  06:38 AM 5.6 4.8 - 5.6 % Final    Comment:    (NOTE) Pre diabetes:          5.7%-6.4%  Diabetes:              >6.4%  Glycemic control for   <7.0% adults with diabetes     CBG: Recent Labs  Lab 06/23/20 1614 06/23/20 1954 06/23/20 2309 06/24/20 0313 06/24/20 0719  GLUCAP 131* 183* 199* 170* 184*   Critical care time:  92 minutes    Noe Gens, MSN, NP-C [facility] 06/24/2020, 8:01 AM   Please see [email or web address] for pager details.

## 2020-06-24 NOTE — Progress Notes (Addendum)
Pharmacy Antibiotic Note  Lisa [name] is a 44 y.o. female admitted on 06/09/2020 with COVID pneumonia now with concern for sepsis.  Pharmacy has been consulted for vancomycin and cefepime dosing.  Patient now on CRRT (started 9/1, running well, abx dosed accordingly) with significantly increased WBC at 68.6 and CRP 20.2. Remains afebrile (on CRRT). Will reculture her given worsening clinical status.   Plan: Vancomycin 2000 mg x1, then 1000 mg q24h  Cefepime 2g IV q12h \ F/u cultures Monitor daily WBC and clinical improvement   Height: 5' 3.75" (161.9 cm) Weight: 95 kg (209 lb 7 oz) IBW/kg (Calculated) : 54.13  Temp (24hrs), Avg:97.7 F (36.5 C), Min:95 F (35 C), Max:99 F (37.2 C)  Recent Labs  Lab 06/20/20 0404 06/20/20 0404 06/21/20 0402 06/21/20 0557 06/22/20 0309 06/23/20 0500 06/23/20 1635 06/24/20 0446  WBC 17.5*  --  13.6*  --  16.4* 36.2*  --  68.6*  CREATININE 0.89   < >  --  1.42* 2.45* 3.76* 3.95* 2.52*   < > = values in this interval not displayed.    Estimated Creatinine Clearance: 32.4 mL/min (A) (by C-G formula based on SCr of 2.52 mg/dL (H)).    Allergies  Allergen Reactions  . Effexor [Venlafaxine Hcl] Other (See Comments)    Pt becomes suicidal  . Percocet [Oxycodone-Acetaminophen] Nausea And Vomiting  . Vicodin [Hydrocodone-Acetaminophen] Nausea And Vomiting    Antimicrobials this admission: 8/26 Azithromycin >> 8/30 8/26 Ceftriaxone >> 8/30  Microbiology results: 8/26 COVID-19 PCR >> Positive 8/25 BCx2 >> negative  9/2 MRSA PCR >>  9/2 Tracheal Aspirate  >>  9/2 BCx2>>  9/2 GI PCR>>   Thank you for allowing pharmacy to be a part of this patient's care.  Claudina Lick, PharmD PGY1 Acute Care Pharmacy Resident 06/24/2020 10:08 AM  Please check [email or web address] for unit-specific pharmacy phone numbers.

## 2020-06-24 NOTE — Consult Note (Signed)
Hospital Consult    Reason for Consult:  Bilateral ischemic feet Referring Physician:  Dr. Lake Bells MRN #:  [id number]  History of Present Illness: This is a 44 y.o. female without significant vascular history admitted with Covid pneumonia.  Over the past 3 or so days she has had discoloration to her bilateral feet.  Last week had a DVT study which demonstrated clot in her left lower extremity did not have any clot in the right lower extremity.  She has been on heparin since this time.  Does not have any documented hypercoagulable issues prior to this.  Patient is intubated and sedated.  Past Medical History:  Diagnosis Date   Asthma 06/17/2020   Eczema    Fibromyalgia    lyrica-pain in legs and arms-controlled   Seasonal allergies     Past Surgical History:  Procedure Laterality Date   DILATION AND CURETTAGE OF UTERUS  2012   LAPAROSCOPIC ASSISTED VAGINAL HYSTERECTOMY  09/02/2012   Procedure: LAPAROSCOPIC ASSISTED VAGINAL HYSTERECTOMY;  Surgeon: Darlyn Chamber, MD;  Location: [facility];  Service: Gynecology;  Laterality: N/A;  with cystoscopy   LIPOMA EXCISION     local   MOUTH SURGERY  2013   NASAL SINUS SURGERY     SEPTOPLASTY     TUBAL LIGATION     WISDOM TOOTH EXTRACTION      Allergies  Allergen Reactions   Effexor [Venlafaxine Hcl] Other (See Comments)    Pt becomes suicidal   Percocet [Oxycodone-Acetaminophen] Nausea And Vomiting   Vicodin [Hydrocodone-Acetaminophen] Nausea And Vomiting    Prior to Admission medications   Medication Sig Start Date End Date Taking? Authorizing Provider  albuterol (VENTOLIN HFA) 108 (90 Base) MCG/ACT inhaler Inhale into the lungs every 6 (six) hours as needed for wheezing or shortness of breath.   Yes [provider]  celecoxib (CELEBREX) 200 MG capsule Take 200 mg by mouth 2 (two) times daily.   Yes [provider]  diphenhydrAMINE (BENADRYL) 25 mg capsule Take 25 mg by mouth every 6 (six) hours as needed. For allergies    Yes [provider]  ergocalciferol (VITAMIN D2) 1.25 MG (50000 UT) capsule Take 50,000 Units by mouth every Tuesday.    Yes [provider]  fluticasone furoate-vilanterol (BREO ELLIPTA) 200-25 MCG/INH AEPB Inhale 1 puff into the lungs daily.   Yes [provider]  montelukast (SINGULAIR) 10 MG tablet Take 10 mg by mouth daily.    Yes [provider]  pregabalin (LYRICA) 200 MG capsule Take 200 mg by mouth at bedtime.    Yes [provider]  vitamin B-12 (CYANOCOBALAMIN) 1000 MCG tablet Take 1,000 mcg by mouth every other day.    Yes [provider]    Social History   Socioeconomic History   Marital status: Married    Spouse name: Not on file   Number of children: Not on file   Years of education: Not on file   Highest education level: Not on file  Occupational History   Not on file  Tobacco Use   Smoking status: Never Smoker  Substance and Sexual Activity   Alcohol use: Yes    Comment: rare   Drug use: No   Sexual activity: Not on file  Other Topics Concern   Not on file  Social History Narrative   Not on file   Social Determinants of Health   Financial Resource Strain:    Difficulty of Paying Living Expenses: Not on file  Food Insecurity:  Worried About Charity fundraiser in the Last Year: Not on file   [employer] of Food in the Last Year: Not on file  Transportation Needs:    Lack of Transportation (Medical): Not on file   Lack of Transportation (Non-Medical): Not on file  Physical Activity:    Days of Exercise per Week: Not on file   Minutes of Exercise per Session: Not on file  Stress:    Feeling of Stress : Not on file  Social Connections:    Frequency of Communication with Friends and Family: Not on file   Frequency of Social Gatherings with Friends and Family: Not on file   Attends Religious Services: Not on file   Active Member of Clubs or Organizations: Not on file   Attends Archivist  Meetings: Not on file   Marital Status: Not on file  Intimate Partner Violence:    Fear of Current or Ex-Partner: Not on file   Emotionally Abused: Not on file   Physically Abused: Not on file   Sexually Abused: Not on file     Family History  Problem Relation Age of Onset   Diabetes Mellitus II Mother     ROS: unable to obtain   Physical Examination  Vitals:   06/24/20 1645 06/24/20 1700  BP:    Pulse: (!) 120   Resp: (!) 35 (!) 35  Temp:    SpO2: 97%    Body mass index is 36.23 kg/m.  General:  intubated and sedated HENT: WNL, normocephalic Pulmonary: intubated Cardiac: There is strong signals anterior tibial, posterior tibial, peroneal at the bilateral ankles 2+ palpable common femoral pulses Abdomen:  soft, NT/ND, no masses Extremities: Bilateral feet are discolored with purple discoloration and are cooler to touch, she is warm to her bilateral ankles Musculoskeletal: no muscle wasting or atrophy  Neurologic: Cannot evaluate   CBC    Component Value Date/Time   WBC 68.6 (HH) 06/24/2020 0446   RBC 3.54 (L) 06/24/2020 0446   HGB 9.9 (L) 06/24/2020 1115   HCT 29.0 (L) 06/24/2020 1115   PLT 342 06/24/2020 0446   MCV 93.5 06/24/2020 0446   MCH 27.7 06/24/2020 0446   MCHC 29.6 (L) 06/24/2020 0446   RDW 14.1 06/24/2020 0446   LYMPHSABS 0.9 06/24/2020 0446   MONOABS 4.1 (H) 06/24/2020 0446   EOSABS 0.0 06/24/2020 0446   BASOSABS 0.1 06/24/2020 0446    BMET    Component Value Date/Time   NA 135 06/24/2020 1115   K 5.0 06/24/2020 1115   CL 104 06/24/2020 0446   CO2 25 06/24/2020 0446   GLUCOSE 210 (H) 06/24/2020 0446   BUN 44 (H) 06/24/2020 0446   CREATININE 2.52 (H) 06/24/2020 0446   CALCIUM 7.5 (L) 06/24/2020 0446   GFRNONAA 23 (L) 06/24/2020 0446   GFRAA 26 (L) 06/24/2020 0446    COAGS: No results found for: INR, PROTIME   Non-Invasive Vascular Imaging DVT study summary:  RIGHT:  - There is no evidence of deep vein thrombosis in the lower  extremity.     - No cystic structure found in the popliteal fossa.     LEFT:  - Findings consistent with acute deep vein thrombosis involving the left  posterior tibial veins, and left peroneal veins.  - No cystic structure found in the popliteal fossa.   ASSESSMENT/PLAN: This is a 44 y.o. female here with Covid pneumonia currently on vasopressors with bilateral cold feet that are discolored.  She does have  very strong signals to her bilateral ankles.  Doubtful that this is any issue with any large vessels however we will get bilateral lower extremity duplexes to evaluate.  If there is not any significant disease would suggest continuing heparin and warming her feet.  She will be at least moderate risk for amputation in the future.  Christan Defranco C. Donzetta Matters, MD [facility] Office: [phone] Pager: [phone]

## 2020-06-24 NOTE — Progress Notes (Signed)
Lab called stating MRSA swab was unable to be run due to blood in sample. Patient with persistently bloody nose. Notified Tomie China regarding unsuccessful sample.

## 2020-06-25 ENCOUNTER — Inpatient Hospital Stay (HOSPITAL_COMMUNITY): Payer: BC Managed Care – PPO

## 2020-06-25 ENCOUNTER — Encounter (HOSPITAL_COMMUNITY): Payer: BC Managed Care – PPO

## 2020-06-25 DIAGNOSIS — N179 Acute kidney failure, unspecified: Secondary | ICD-10-CM

## 2020-06-25 DIAGNOSIS — J80 Acute respiratory distress syndrome: Secondary | ICD-10-CM

## 2020-06-25 LAB — CBC WITH DIFFERENTIAL/PLATELET
Abs Immature Granulocytes: 0 10*3/uL (ref 0.00–0.07)
Band Neutrophils: 0 %
Basophils Absolute: 0 10*3/uL (ref 0.0–0.1)
Basophils Relative: 0 %
Blasts: 0 %
Eosinophils Absolute: 0 10*3/uL (ref 0.0–0.5)
Eosinophils Relative: 0 %
HCT: 29 % — ABNORMAL LOW (ref 36.0–46.0)
Hemoglobin: 8.7 g/dL — ABNORMAL LOW (ref 12.0–15.0)
Lymphocytes Relative: 4 %
Lymphs Abs: 2.7 10*3/uL (ref 0.7–4.0)
MCH: 27.9 pg (ref 26.0–34.0)
MCHC: 30 g/dL (ref 30.0–36.0)
MCV: 92.9 fL (ref 80.0–100.0)
Metamyelocytes Relative: 0 %
Monocytes Absolute: 1.4 10*3/uL — ABNORMAL HIGH (ref 0.1–1.0)
Monocytes Relative: 2 %
Myelocytes: 0 %
Neutro Abs: 63.7 10*3/uL — ABNORMAL HIGH (ref 1.7–7.7)
Neutrophils Relative %: 94 %
Other: 0 %
Platelets: 220 10*3/uL (ref 150–400)
Promyelocytes Relative: 0 %
RBC: 3.12 MIL/uL — ABNORMAL LOW (ref 3.87–5.11)
RDW: 14.4 % (ref 11.5–15.5)
WBC Morphology: INCREASED
WBC: 67.8 10*3/uL (ref 4.0–10.5)
nRBC: 0 /100 WBC
nRBC: 0.9 % — ABNORMAL HIGH (ref 0.0–0.2)

## 2020-06-25 LAB — RENAL FUNCTION PANEL
Albumin: 1.6 g/dL — ABNORMAL LOW (ref 3.5–5.0)
Anion gap: 9 (ref 5–15)
BUN: 20 mg/dL (ref 6–20)
CO2: 23 mmol/L (ref 22–32)
Calcium: 5.5 mg/dL — CL (ref 8.9–10.3)
Chloride: 102 mmol/L (ref 98–111)
Creatinine, Ser: 1.36 mg/dL — ABNORMAL HIGH (ref 0.44–1.00)
GFR calc Af Amer: 55 mL/min — ABNORMAL LOW (ref >60)
GFR calc non Af Amer: 48 mL/min — ABNORMAL LOW (ref >60)
Glucose, Bld: 113 mg/dL — ABNORMAL HIGH (ref 70–99)
Phosphorus: 2.6 mg/dL (ref 2.5–4.6)
Potassium: 4.6 mmol/L (ref 3.5–5.1)
Sodium: 134 mmol/L — ABNORMAL LOW (ref 135–145)

## 2020-06-25 LAB — POCT I-STAT 7, (LYTES, BLD GAS, ICA,H+H)
Acid-base deficit: 3 mmol/L — ABNORMAL HIGH (ref 0.0–2.0)
Bicarbonate: 26.3 mmol/L (ref 20.0–28.0)
Calcium, Ion: 0.91 mmol/L — ABNORMAL LOW (ref 1.15–1.40)
HCT: 28 % — ABNORMAL LOW (ref 36.0–46.0)
Hemoglobin: 9.5 g/dL — ABNORMAL LOW (ref 12.0–15.0)
O2 Saturation: 95 %
Patient temperature: 97.4
Potassium: 5.1 mmol/L (ref 3.5–5.1)
Sodium: 135 mmol/L (ref 135–145)
TCO2: 28 mmol/L (ref 22–32)
pCO2 arterial: 69.6 mmHg (ref 32.0–48.0)
pH, Arterial: 7.182 — CL (ref 7.350–7.450)
pO2, Arterial: 94 mmHg (ref 83.0–108.0)

## 2020-06-25 LAB — COMPREHENSIVE METABOLIC PANEL
ALT: 21 U/L (ref 0–44)
AST: 34 U/L (ref 15–41)
Albumin: 1.8 g/dL — ABNORMAL LOW (ref 3.5–5.0)
Alkaline Phosphatase: 111 U/L (ref 38–126)
Anion gap: 8 (ref 5–15)
BUN: 28 mg/dL — ABNORMAL HIGH (ref 6–20)
CO2: 25 mmol/L (ref 22–32)
Calcium: 6.5 mg/dL — ABNORMAL LOW (ref 8.9–10.3)
Chloride: 103 mmol/L (ref 98–111)
Creatinine, Ser: 1.68 mg/dL — ABNORMAL HIGH (ref 0.44–1.00)
GFR calc Af Amer: 43 mL/min — ABNORMAL LOW (ref >60)
GFR calc non Af Amer: 37 mL/min — ABNORMAL LOW (ref >60)
Glucose, Bld: 191 mg/dL — ABNORMAL HIGH (ref 70–99)
Potassium: 4.7 mmol/L (ref 3.5–5.1)
Sodium: 136 mmol/L (ref 135–145)
Total Bilirubin: 0.7 mg/dL (ref 0.3–1.2)
Total Protein: 5.4 g/dL — ABNORMAL LOW (ref 6.5–8.1)

## 2020-06-25 LAB — PATHOLOGIST SMEAR REVIEW

## 2020-06-25 LAB — PROCALCITONIN: Procalcitonin: 5.89 ng/mL

## 2020-06-25 LAB — GLUCOSE, CAPILLARY
Glucose-Capillary: 170 mg/dL — ABNORMAL HIGH (ref 70–99)
Glucose-Capillary: 143 mg/dL — ABNORMAL HIGH (ref 70–99)
Glucose-Capillary: 149 mg/dL — ABNORMAL HIGH (ref 70–99)
Glucose-Capillary: 107 mg/dL — ABNORMAL HIGH (ref 70–99)
Glucose-Capillary: 101 mg/dL — ABNORMAL HIGH (ref 70–99)

## 2020-06-25 LAB — HEPARIN LEVEL (UNFRACTIONATED)
Heparin Unfractionated: 0.23 IU/mL — ABNORMAL LOW (ref 0.30–0.70)
Heparin Unfractionated: 0.2 IU/mL — ABNORMAL LOW (ref 0.30–0.70)

## 2020-06-25 LAB — PHOSPHORUS: Phosphorus: 3.5 mg/dL (ref 2.5–4.6)

## 2020-06-25 LAB — CORTISOL: Cortisol, Plasma: 26.4 ug/dL

## 2020-06-25 LAB — MAGNESIUM: Magnesium: 2.6 mg/dL — ABNORMAL HIGH (ref 1.7–2.4)

## 2020-06-25 MED ORDER — DIPHENHYDRAMINE HCL 50 MG/ML IJ SOLN: 25.0000 mg | INTRAMUSCULAR | Status: DC | PRN

## 2020-06-25 MED ORDER — GLYCOPYRROLATE 0.2 MG/ML IJ SOLN: 0.2000 mg | INTRAMUSCULAR | Status: DC | PRN

## 2020-06-25 MED ORDER — SODIUM CHLORIDE 0.9 % IV SOLN
2.0000 g | INTRAVENOUS | Status: DC
  Administered 2020-06-25: 2 g via INTRAVENOUS
  Filled 2020-06-25: qty 20

## 2020-06-25 MED ORDER — DEXTROSE 5 % IV SOLN: INTRAVENOUS | Status: DC

## 2020-06-25 MED ORDER — POLYVINYL ALCOHOL 1.4 % OP SOLN
1.0000 [drp] | Freq: Four times a day (QID) | OPHTHALMIC | Status: DC | PRN
  Filled 2020-06-25: qty 15

## 2020-06-25 MED ORDER — MORPHINE 100MG IN NS 100ML (1MG/ML) PREMIX INFUSION
0.0000 mg/h | INTRAVENOUS | Status: DC
  Administered 2020-06-25: 1 mg/h via INTRAVENOUS
  Filled 2020-06-25: qty 100

## 2020-06-25 MED ORDER — MIDAZOLAM HCL 2 MG/2ML IJ SOLN: 1.0000 mg | INTRAMUSCULAR | Status: DC | PRN

## 2020-06-25 MED ORDER — VASOPRESSIN 20 UNITS/100 ML INFUSION FOR SHOCK
0.0000 [IU]/min | INTRAVENOUS | Status: DC
  Administered 2020-06-25 (×2): 0.03 [IU]/min via INTRAVENOUS
  Filled 2020-06-25 (×2): qty 100

## 2020-06-25 MED ORDER — INSULIN DETEMIR 100 UNIT/ML ~~LOC~~ SOLN
35.0000 [IU] | Freq: Two times a day (BID) | SUBCUTANEOUS | Status: DC
  Administered 2020-06-25: 35 [IU] via SUBCUTANEOUS
  Filled 2020-06-25 (×2): qty 0.35

## 2020-06-25 MED ORDER — MORPHINE BOLUS VIA INFUSION
5.0000 mg | INTRAVENOUS | Status: DC | PRN
  Filled 2020-06-25: qty 5

## 2020-06-25 MED ORDER — MIDAZOLAM BOLUS VIA INFUSION (WITHDRAWAL LIFE SUSTAINING TX)
2.0000 mg | INTRAVENOUS | Status: DC | PRN
  Filled 2020-06-25: qty 2

## 2020-06-25 MED ORDER — GLYCOPYRROLATE 1 MG PO TABS: 1.0000 mg | ORAL_TABLET | ORAL | Status: DC | PRN

## 2020-06-25 MED ORDER — CALCIUM GLUCONATE-NACL 1-0.675 GM/50ML-% IV SOLN
1.0000 g | Freq: Once | INTRAVENOUS | Status: AC
  Administered 2020-06-25: 1000 mg via INTRAVENOUS
  Filled 2020-06-25: qty 50

## 2020-06-25 MED ORDER — ACETAMINOPHEN 325 MG PO TABS: 650.0000 mg | ORAL_TABLET | Freq: Four times a day (QID) | ORAL | Status: DC | PRN

## 2020-06-25 MED ORDER — MIDAZOLAM 50MG/50ML (1MG/ML) PREMIX INFUSION: 0.0000 mg/h | INTRAVENOUS | Status: DC

## 2020-06-25 MED ORDER — IOHEXOL 350 MG/ML SOLN
100.0000 mL | Freq: Once | INTRAVENOUS | Status: AC | PRN
  Administered 2020-06-25: 100 mL via INTRAVENOUS

## 2020-06-25 MED ORDER — EPINEPHRINE HCL 5 MG/250ML IV SOLN IN NS
0.5000 ug/min | INTRAVENOUS | Status: DC
  Administered 2020-06-25: 16:00:00 0.5 ug/min via INTRAVENOUS
  Administered 2020-06-25: 20 ug/min via INTRAVENOUS
  Filled 2020-06-25 (×2): qty 250

## 2020-06-25 MED ORDER — MORPHINE SULFATE (PF) 2 MG/ML IV SOLN: 2.0000 mg | INTRAVENOUS | Status: DC | PRN

## 2020-06-25 MED ORDER — ACETAMINOPHEN 650 MG RE SUPP: 650.0000 mg | Freq: Four times a day (QID) | RECTAL | Status: DC | PRN

## 2020-06-26 LAB — CULTURE, RESPIRATORY W GRAM STAIN

## 2020-06-29 LAB — CULTURE, BLOOD (ROUTINE X 2)
Culture: NO GROWTH
Special Requests: ADEQUATE

## 2020-06-30 LAB — PATHOLOGIST SMEAR REVIEW

## 2020-07-23 NOTE — Progress Notes (Signed)
CRITICAL VALUE ALERT  Critical Value:  Calcium 5.5  Date & Time Notied:  07-22-20 4:44 PM   Provider Notified: Noe Gens NP  Orders Received/Actions taken: calcium gluconate ordered

## 2020-07-23 NOTE — Progress Notes (Signed)
[facility] PCCM  Worsening shock Add epinephrine infusion Will reach out to her husband  Roselie Awkward, MD [facility] Pager: [phone] Cell: [phone] If no response, call [phone]

## 2020-07-23 NOTE — Progress Notes (Addendum)
PCCM interval progress note:  Discussed goals of care with patient's husband, he understands that maximal care has been given and patient continues to decline.  He feels comfortable with transitioning to comfort care and understands that patient may pass quickly when life support measures are withdrawn.  Condolences offered and comfort care orders entered.    Otilio Carpen Symon [place]wood, PA-C

## 2020-07-23 NOTE — Progress Notes (Signed)
Head turn and reposition attempted with this RN and Remo Lipps, RRT. Patient desat, CRRT alarming high pressure. BP decreased. Patient unable to tolerate left sided turn. Repositioned to previous side.

## 2020-07-23 NOTE — Progress Notes (Signed)
Husband came to bedside.  Unable to stay in room longer than 5 minutes.  Currently waiting in the waiting area for ground team for further instructions on how to proceed. Wallet, glasses, cell phone, cell phone charger, and undergarments given to husband.

## 2020-07-23 NOTE — Progress Notes (Addendum)
[facility] ROUNDING NOTE   Subjective:   Brief history: This is  a 44 y.o. female.  She was admitted on 06/22/2020 with Covid pneumonia.  She developed acute hypoxic respiratory failure secondary to ARDS and Covid 19 pneumonia and sepsis.  She required mechanical ventilation.  Her baseline serum creatinine on 06/20/2020 was 0.89 mg/dL on 06/21/2020 increased to 1.4 mg deciliter 06/22/2020 2.4 mg deciliter 06/23/2020 3.76 mg/dL.  Urine output has been dwindling.  06/22/2000 421 cc.  She is adequately volume resuscitated and positive balance about 5 L.  CRRT was initiated 06/23/2020 in consultation with critical care medicine.  Multiple purple feet not thought to be secondary to any large vessel disease appreciate assistance from Dr. Siri Cole.  Blood pressure 9453 pulse 114 temperature 98.6 O2 sats 97% FiO2 40%  Ultrafiltration rate.  Being kept even 06/24/2020  Sodium 136 potassium 4.7 chloride 103 CO2 25 BUN 28 creatinine 1.68 glucose 191 calcium 6.5 phosphorus 3.5 magnesium 2.6 albumin 1.8  IV heparin IV norepinephrine    Objective:  Vital signs in last 24 hours:  Temp:  [96.8 F (36 C)-99.6 F (37.6 C)] 98.6 F (37 C) (09/03 0400) Pulse Rate:  [107-128] 123 (09/03 0400) Resp:  [31-35] 35 (09/03 0600) BP: (103)/(54) 103/54 (09/02 0820) SpO2:  [95 %-100 %] 97 % (09/03 0400) Arterial Line BP: (90-123)/(37-73) 98/56 (09/03 0600) FiO2 (%):  [80 %] 80 % (09/03 0400) Weight:  [95 kg] 95 kg (09/03 0500)  Weight change: 0 kg Filed Weights   06/23/20 0500 06/24/20 0500 07/08/2020 0500  Weight: 95 kg 95 kg 95 kg    Intake/Output: I/O last 3 completed shifts: In: 4623.3 [I.V.:2278; VE/HM:0947; IV Piggyback:600.3] Out: 5700 [Urine:10; Other:5590; Stool:100]   Intake/Output this shift:  No intake/output data recorded.  Alert female   lying prone sedated CVS- RRR no murmurs rubs gallops no JVD RS-mechanically supported breath sounds ABD-hypoactive EXT-mottled with  diffuse anasarca   Basic Metabolic Panel: Recent Labs  Lab 06/20/20 0404 06/20/20 2000 06/21/20 0557 06/21/20 1120 06/21/20 1709 06/21/20 1950 06/22/20 0309 06/22/20 1043 06/23/20 0500 06/23/20 1154 06/23/20 1635 06/23/20 1635 06/24/20 0446 06/24/20 1115 06/24/20 1600 2020-07-08 0501  NA   < >  --  147*   < >  --    < > 143   < > 142   < > 141  --  138 135 134* 136  K   < >  --  4.5   < >  --    < > 4.6   < > 3.7   < > 4.2  --  4.9 5.0 4.6 4.7  CL   < >  --  111  --   --   --  109  --  107  --  107  --  104  --  101 103  CO2   < >  --  27  --   --   --  23  --  22  --  23  --  25  --  22 25  GLUCOSE   < >  --  388*  --   --   --  460*  --  275*  --  145*  --  210*  --  204* 191*  BUN   < >  --  42*  --   --   --  63*  --  87*  --  81*  --  44*  --  36* 28*  CREATININE   < >  --  1.42*  --   --   --  2.45*  --  3.76*  --  3.95*  --  2.52*  --  2.09* 1.68*  CALCIUM   < >  --  7.6*  --   --   --  7.5*  --  7.5*  --  7.4*   < > 7.5*  --  6.8* 6.5*  MG  --  2.8* 3.1*  --  3.1*  --   --   --   --   --   --   --  2.7*  --   --  2.6*  PHOS   < > 5.3* 5.1*  --  4.8*   < > 5.8*  --   --   --  5.5*  --  4.7*  --  3.9 3.5   < > = values in this interval not displayed.    Liver Function Tests: Recent Labs  Lab 06/21/20 0557 06/21/20 0557 06/22/20 0309 06/22/20 0309 06/23/20 0500 06/23/20 1635 06/24/20 0446 06/24/20 1600 07-18-2020 0501  AST 16  --  28  --  25  --  32  --  34  ALT 16  --  16  --  19  --  28  --  21  ALKPHOS 75  --  58  --  70  --  93  --  111  BILITOT 0.5  --  0.3  --  0.7  --  0.9  --  0.7  PROT 5.3*  --  4.9*  --  4.5*  --  5.4*  --  5.4*  ALBUMIN 2.0*   < > 2.0*   < > 1.9* 2.0* 2.1* 1.8* 1.8*   < > = values in this interval not displayed.   No results for input(s): LIPASE, AMYLASE in the last 168 hours. No results for input(s): AMMONIA in the last 168 hours.  CBC: Recent Labs  Lab 06/20/20 0404 06/20/20 0628 06/21/20 0402 06/21/20 1120 06/22/20 0309  06/22/20 1043 06/23/20 0500 06/23/20 1154 06/23/20 1617 06/24/20 0446 06/24/20 1115  WBC 17.5*  --  13.6*  --  16.4*  --  36.2*  --   --  68.6*  --   NEUTROABS 15.1*  --  10.9*  --  13.1*  --  29.1*  --   --  58.4*  --   HGB 13.2   < > 10.7*   < > 12.3   < > 10.6* 10.9* 10.2* 9.8* 9.9*  HCT 45.6   < > 37.8   < > 42.9   < > 36.2 32.0* 30.0* 33.1* 29.0*  MCV 94.0  --  100.0  --  98.6  --  97.1  --   --  93.5  --   PLT 131*  --  149*  --  232  --  307  --   --  342  --    < > = values in this interval not displayed.    Cardiac Enzymes: No results for input(s): CKTOTAL, CKMB, CKMBINDEX, TROPONINI in the last 168 hours.  BNP: Invalid input(s): POCBNP  CBG: Recent Labs  Lab 06/24/20 1122 06/24/20 1537 06/24/20 1935 06/24/20 2316 2020/07/18 0304  GLUCAP 194* 174* 213* 133* 170*    Microbiology: Results for orders placed or performed during the hospital encounter of 06/15/2020  Blood Culture (routine x 2)     Status: None   Collection Time: 06/10/2020 10:30 PM  Specimen: BLOOD  Result Value Ref Range Status   Specimen Description BLOOD RIGHT ARM  Final   Special Requests   Final    BOTTLES DRAWN AEROBIC AND ANAEROBIC Blood Culture adequate volume   Culture   Final    NO GROWTH 5 DAYS Performed at [facility], [address] [place], [place] [address]    Report Status 06/22/2020 FINAL  Final  Blood Culture (routine x 2)     Status: None   Collection Time: 06/09/2020 10:50 PM   Specimen: BLOOD  Result Value Ref Range Status   Specimen Description BLOOD LEFT ARM  Final   Special Requests   Final    BOTTLES DRAWN AEROBIC ONLY Blood Culture adequate volume   Culture   Final    NO GROWTH 5 DAYS Performed at [facility], [place] [address] [place], [place] [address]    Report Status 06/22/2020 FINAL  Final  SARS Coronavirus 2 by RT PCR (hospital order, performed in [facility] hospital lab) Nasopharyngeal Nasopharyngeal Swab     Status: Abnormal   Collection Time:  06/17/20  2:19 AM   Specimen: Nasopharyngeal Swab  Result Value Ref Range Status   SARS Coronavirus 2 POSITIVE (A) NEGATIVE Final    Comment: RESULT CALLED TO, READ BACK BY AND VERIFIED WITH: L HARRISON RN 06/17/20 0330 JDW (NOTE) SARS-CoV-2 target nucleic acids are DETECTED  SARS-CoV-2 RNA is generally detectable in upper respiratory specimens  during the acute phase of infection.  Positive results are indicative  of the presence of the identified virus, but do not rule out bacterial infection or co-infection with other pathogens not detected by the test.  Clinical correlation with patient history and  other diagnostic information is necessary to determine patient infection status.  The expected result is negative.  Fact Sheet for Patients:   StrictlyIdeas.no   Fact Sheet for Healthcare Providers:   BankingDealers.co.za    This test is not yet approved or cleared by the [place] FDA and  has been authorized for detection and/or diagnosis of SARS-CoV-2 by FDA under an Emergency Use Authorization (EUA).  This EUA will remain in effect (meaning this test c an be used) for the duration of  the COVID-19 declaration under Section 564(b)(1) of the Act, 21 U.S.C. section 360-bbb-3(b)(1), unless the authorization is terminated or revoked sooner.  Performed at [facility], [place] [address], [place], [place] [address]   MRSA PCR Screening     Status: Abnormal   Collection Time: 06/23/20  6:30 PM   Specimen: Nasal Mucosa; Nasopharyngeal  Result Value Ref Range Status   MRSA by PCR (A) NEGATIVE Final    INVALID, UNABLE TO DETERMINE THE PRESENCE OF TARGET DUE TO SPECIMEN INTEGRITY. RECOLLECTION REQUESTED.    Comment: RESULT CALLED TO, READ BACK BY AND VERIFIED WITH: Olen Pel RN 06/24/20 0210 JDW Performed at [facility], [address] [place],  [address]   Gastrointestinal Panel by PCR , Stool     Status: None    Collection Time: 06/24/20 11:41 AM   Specimen: Stool  Result Value Ref Range Status   Campylobacter species NOT DETECTED NOT DETECTED Final   Plesimonas shigelloides NOT DETECTED NOT DETECTED Final   Salmonella species NOT DETECTED NOT DETECTED Final   Yersinia enterocolitica NOT DETECTED NOT DETECTED Final   Vibrio species NOT DETECTED NOT DETECTED Final   Vibrio cholerae NOT DETECTED NOT DETECTED Final   Enteroaggregative E coli (EAEC) NOT  DETECTED NOT DETECTED Final   Enteropathogenic E coli (EPEC) NOT DETECTED NOT DETECTED Final   Enterotoxigenic E coli (ETEC) NOT DETECTED NOT DETECTED Final   Shiga like toxin producing E coli (STEC) NOT DETECTED NOT DETECTED Final   Shigella/Enteroinvasive E coli (EIEC) NOT DETECTED NOT DETECTED Final   Cryptosporidium NOT DETECTED NOT DETECTED Final   Cyclospora cayetanensis NOT DETECTED NOT DETECTED Final   Entamoeba histolytica NOT DETECTED NOT DETECTED Final   Giardia lamblia NOT DETECTED NOT DETECTED Final   Adenovirus F40/41 NOT DETECTED NOT DETECTED Final   Astrovirus NOT DETECTED NOT DETECTED Final   Norovirus GI/GII NOT DETECTED NOT DETECTED Final   Rotavirus A NOT DETECTED NOT DETECTED Final   Sapovirus (I, II, IV, and V) NOT DETECTED NOT DETECTED Final    Comment: Performed at [facility], [place] [place], [place] [address]  C Difficile Quick Screen (NO PCR Reflex)     Status: None   Collection Time: 06/24/20 11:41 AM   Specimen: Stool  Result Value Ref Range Status   C Diff antigen NEGATIVE NEGATIVE Final   C Diff toxin NEGATIVE NEGATIVE Final   C Diff interpretation No C. difficile detected.  Final    Comment: Performed at [facility], [place] [address] [place], [place] [address]  MRSA PCR Screening     Status: None   Collection Time: 06/24/20  4:08 PM   Specimen: Nasal Mucosa; Nasopharyngeal  Result Value Ref Range Status   MRSA by PCR NEGATIVE NEGATIVE Final    Comment:        The GeneXpert MRSA Assay  (FDA approved for NASAL specimens only), is one component of a comprehensive MRSA colonization surveillance program. It is not intended to diagnose MRSA infection nor to guide or monitor treatment for MRSA infections. Performed at [facility], [place] [address] [place], [place] [address]   Culture, respiratory     Status: None (Preliminary result)   Collection Time: 06/24/20  5:45 PM   Specimen: Tracheal Aspirate  Result Value Ref Range Status   Specimen Description TRACHEAL ASPIRATE  Final   Special Requests NONE  Final   Gram Stain   Final    MODERATE WBC PRESENT, PREDOMINANTLY PMN MODERATE GRAM POSITIVE COCCI IN PAIRS IN CLUSTERS Performed at [facility], [place] [address] [place], [place] [address]    Culture PENDING  Incomplete   Report Status PENDING  Incomplete    Coagulation Studies: No results for input(s): LABPROT, INR in the last 72 hours.  Urinalysis: No results for input(s): COLORURINE, LABSPEC, PHURINE, GLUCOSEU, HGBUR, BILIRUBINUR, KETONESUR, PROTEINUR, UROBILINOGEN, NITRITE, LEUKOCYTESUR in the last 72 hours.  Invalid input(s): APPERANCEUR    Imaging: DG CHEST PORT 1 VIEW  Result Date: 2020-06-29 CLINICAL DATA:  Acute respiratory failure with hypoxia EXAM: PORTABLE CHEST 1 VIEW COMPARISON:  06/23/2020 and prior radiographs FINDINGS: An endotracheal tube with tip 2 cm above the carina, RIGHT IJ central venous catheter with tip overlying the LOWER SVC and NG tube entering the stomach with tip off the field of view again noted. Diffuse bilateral airspace opacities are unchanged. No large pleural effusion or pneumothorax noted. Little interval change since the prior study. IMPRESSION: Unchanged appearance of the chest with diffuse bilateral airspace opacities. Electronically Signed   By: Margarette Canada M.D.   On: 2020-06-29 06:52   DG CHEST PORT 1 VIEW  Result Date: 06/23/2020 CLINICAL DATA:  Central line placement. EXAM: PORTABLE CHEST 1 VIEW COMPARISON:  June 20, 2020. FINDINGS: The heart size and mediastinal contours are within normal limits. Endotracheal and nasogastric tubes appear to be in good position. Stable position of right-sided PICC line. Interval placement of right internal jugular catheter with distal tip in expected position of the SVC. Stable bilateral diffuse lung opacities are noted consistent with multifocal pneumonia. No pneumothorax or pleural effusion is noted. The visualized skeletal structures are unremarkable. IMPRESSION: Interval placement of right internal jugular catheter with distal tip in expected position of the SVC. Stable bilateral diffuse lung opacities consistent with multifocal pneumonia. Electronically Signed   By: Marijo Conception M.D.   On: 06/23/2020 11:55   ECHOCARDIOGRAM LIMITED  Result Date: 06/24/2020    ECHOCARDIOGRAM LIMITED REPORT   Patient Name:   Lisa [name] Date of Exam: 06/24/2020 Medical Rec #:  [id number]       Height:       63.7 in Accession #:    [id number]      Weight:       209.4 lb Date of Birth:  1976/01/29       BSA:          1.989 m Patient Age:    26 years        BP:           103/54 mmHg Patient Gender: F               HR:           121 bpm. Exam Location:  Inpatient Procedure: Limited Color Doppler, Cardiac Doppler and Limited Echo Indications:    dyspnea  History:        Patient has no prior history of Echocardiogram examinations.                 Covid pneumonia. asthma.  Sonographer:    Johny Chess Referring Phys: [place]  1. Left ventricular ejection fraction, by estimation, is >75%. The left ventricle has hyperdynamic function. The left ventricle has no regional wall motion abnormalities.  2. Significant RV enlargement and failure . Right ventricular systolic function is severely reduced. The right ventricular size is severely enlarged. There is moderately elevated pulmonary artery systolic pressure.  3. The mitral valve is normal in structure. Trivial mitral  valve regurgitation. No evidence of mitral stenosis.  4. Tricuspid valve regurgitation is moderate.  5. The aortic valve is normal in structure. Aortic valve regurgitation is not visualized. No aortic stenosis is present.  6. The inferior vena cava is dilated in size with <50% respiratory variability, suggesting right atrial pressure of 15 mmHg. FINDINGS  Left Ventricle: Left ventricular ejection fraction, by estimation, is >75%. The left ventricle has hyperdynamic function. The left ventricle has no regional wall motion abnormalities. The left ventricular internal cavity size was small. There is no left  ventricular hypertrophy. Right Ventricle: Significant RV enlargement and failure. The right ventricular size is severely enlarged. Right vetricular wall thickness was not assessed. Right ventricular systolic function is severely reduced. There is moderately elevated pulmonary artery systolic pressure. The tricuspid regurgitant velocity is 3.73 m/s, and with an assumed right atrial pressure of 3 mmHg, the estimated right ventricular systolic pressure is 63.8 mmHg. Left Atrium: Left atrial size was normal in size. Right Atrium: Right atrial size was normal in size. Pericardium: There is no evidence of pericardial effusion. Mitral Valve: The mitral valve is normal in structure. Normal mobility of the mitral valve leaflets. Trivial mitral valve regurgitation. No evidence  of mitral valve stenosis. Tricuspid Valve: The tricuspid valve is normal in structure. Tricuspid valve regurgitation is moderate . No evidence of tricuspid stenosis. Aortic Valve: The aortic valve is normal in structure. Aortic valve regurgitation is not visualized. No aortic stenosis is present. Pulmonic Valve: The pulmonic valve was normal in structure. Pulmonic valve regurgitation is mild. No evidence of pulmonic stenosis. Aorta: The aortic root is normal in size and structure. Venous: The inferior vena cava is dilated in size with less than 50%  respiratory variability, suggesting right atrial pressure of 15 mmHg. IAS/Shunts: No atrial level shunt detected by color flow Doppler. LEFT VENTRICLE PLAX 2D LVIDd:         3.20 cm Diastology LVIDs:         1.80 cm LV e' lateral:   9.68 cm/s LV PW:         1.00 cm LV E/e' lateral: 7.8 LV IVS:        0.80 cm LV e' medial:    8.27 cm/s                        LV E/e' medial:  9.1  RIGHT VENTRICLE             IVC RV S prime:     16.90 cm/s  IVC diam: 1.60 cm TAPSE (M-mode): 1.1 cm LEFT ATRIUM         Index LA diam:    2.70 cm 1.36 cm/m   AORTA Ao Root diam: 2.80 cm MITRAL VALVE               TRICUSPID VALVE MV Area (PHT): 3.06 cm    TR Peak grad:   55.7 mmHg MV Decel Time: 248 msec    TR Vmax:        373.00 cm/s MV E velocity: 75.40 cm/s MV A velocity: 72.80 cm/s MV E/A ratio:  1.04 Jenkins Rouge MD Electronically signed by Jenkins Rouge MD Signature Date/Time: 06/24/2020/3:32:42 PM    Final      Medications:   .  prismasol BGK 4/2.5 500 mL/hr at 06/24/20 1210  .  prismasol BGK 4/2.5 500 mL/hr at 06/24/20 1216  . sodium chloride 10 mL/hr at 2020/07/20 0600  . sodium chloride    . ceFEPime (MAXIPIME) IV    . cisatracurium (NIMBEX) infusion 2 mcg/kg/min (July 20, 2020 0600)  . dextrose    . famotidine (PEPCID) IV Stopped (06/24/20 2127)  . feeding supplement (VITAL AF 1.2 CAL) 1,000 mL (06/24/20 0100)  . fentaNYL infusion INTRAVENOUS 225 mcg/hr (July 20, 2020 0600)  . heparin 350 Units/hr (07-20-2020 0600)  . midazolam 2 mg/hr (07/20/2020 0600)  . norepinephrine (LEVOPHED) Adult infusion 26 mcg/min (Jul 20, 2020 0600)  . prismasol BGK 4/2.5 2,000 mL/hr at 06/24/20 1807  . vancomycin     . artificial tears  1 application Both Eyes W4X  . vitamin C  500 mg Per Tube Daily  . baricitinib  2 mg Per Tube Daily  . budesonide (PULMICORT) nebulizer solution  0.5 mg Nebulization BID  . chlorhexidine gluconate (MEDLINE KIT)  15 mL Mouth Rinse BID  . Chlorhexidine Gluconate Cloth  6 each Topical Daily  . docusate  100 mg Per  Tube BID  . feeding supplement (PROSource TF)  45 mL Per Tube QID  . insulin aspart  10 Units Subcutaneous Q4H  . insulin aspart  3-9 Units Subcutaneous Q4H  . insulin detemir  33 Units Subcutaneous Q12H  . linagliptin  5 mg  Per Tube Daily  . mouth rinse  15 mL Mouth Rinse 10 times per day  . methylPREDNISolone (SOLU-MEDROL) injection  0.5 mg/kg Intravenous BID  . montelukast  10 mg Per Tube QHS  . polyethylene glycol  17 g Per Tube Daily  . pregabalin  100 mg Per Tube Daily  . sodium chloride flush  10-40 mL Intracatheter Q12H  . vitamin B-12  1,000 mcg Per Tube Daily  . zinc sulfate  220 mg Per Tube Daily   Place/Maintain arterial line **AND** sodium chloride, acetaminophen **OR** acetaminophen, clonazePAM, dextrose, diphenhydrAMINE, docusate, fentaNYL, guaiFENesin-dextromethorphan, heparin, ipratropium-albuterol, midazolam, ondansetron **OR** ondansetron (ZOFRAN) IV, polyethylene glycol, sodium chloride, sodium chloride flush  Assessment/ Plan:  1.Acute oliguric renal failure.  There appears to be progression of acute renal failure in the setting of sepsis and pneumonia and hypoxic respiratory failure.  Secondary to COVID-19 infection.  CRRT initiated 06/23/2020.  4/2.5 bags no change to prescription. 2. Hypertension/volume  -appears to be volume overloaded.  Positive fluid balance we will try to remove fluid but may be challenging as patient is requiring pressors.  Patient being kept even on CRRT. Will hold CRRT next 24 hours view to restart 9/4  - severe shortage of nursing staff in ICU 3.  COVID-19 pneumonia.  Therapy as per critical care 4.  Shock broad-spectrum IV steroids and treatment for COVID-19.  IV pressors 5.  Ventilator dependent respiratory failure.  Improved oxygenation noted.    LOS: [place] _0 _1 :17 AM

## 2020-07-23 NOTE — Progress Notes (Addendum)
NAME:  Lisa [name], MRN:  [id number], DOB:  29-Apr-1976, LOS: 8 ADMISSION DATE:  06/04/2020, CONSULTATION DATE: 06/17/2020 REFERRING MD: Dr. Aleene Davidson, CHIEF COMPLAINT: COVID-19 pneumonia  Brief History   44 year old female, with PMH of asthma, BMI 30 who presented to [facility] on 8/25 with acute hypoxic respiratory failure in the setting of bilateral infiltrates, COVID-19 positive.  She failed escalating O2 delivery and ultimately was intubated on 8/29.  She developed MSOF requiring initiation of CVVHD on 9/1, paralytics for ventilator synchrony.   Past Medical History  Asthma  Eczema Fibromyalgia Seasonal Allergies  Significant Hospital Events   8/25 Admit  8/26 PCCM consulted / ICU admission 8/31 No further paralytics required. Remains proned on 70%. New le dvt noted and started on heparin gtt overnight 9/01 Minimal improvement with proning overnight with oxygen but still acidotic. On pressors, paralytics, CVVHD initiated. 9/02 PEEP 18 / FiO2 80%, 18 mcg levophed  9/03 Worsening shock, levo at 30 mcg   Consults:  PCCM Nephrology 9/1  Procedures:  ETT 8/29 >> L Rad Aline 8/31 >>  R IJ HD 9/1 >>  Significant Diagnostic Tests:   8/30 LE Doppler >> negative on right, positive acute DVT in L posterior tibial veins, left peroneal veins   9/02 ECHO >> LVEF >75%, no RWMA, significant RV enlargement and failure. RV systolic pressure severely reduced. RV severely enlarged, moderately elevated pulmonary artery systolic pressure, moderate tricuspid valve regurgitation, RA pressure 15  CT Angio Chest 9/3 >>   CT ABD/Pelvis 9/3 >>  Micro Data:  COVID-19 PCR 8/26 >> Positive BCx2 8/25 >> negative  Tracheal Aspirate 8/26 >> not sent MRSA PCR 9/2 >> negative  Tracheal Aspirate 9/2 >> moderate GPC's in pairs/clusters >>  BCx2 9/2 >>   GI PCR 9/2 >> negative C-Diff 9/2 >> negative   Antimicrobials:  Remdesivir 8/25-8/30 Baricitinib 8/26 >> Cefepime 9/2 >>  Vanco 9/2 >>   Interim  history/subjective:  Afebrile / active warming measures in process WBC 67k Vent - 18 PEEP, FiO2 80%, Peak 35, Pplat 32, Driving pressure 14, P/f ratio 82 Glucose 143-191  I/O - 72ml urinary output, 4.1L removed with HD, -733ml in last 24 hours CVVHD ongoing, pulling 50-100 RN reports increased vasopressor needs  Objective   Blood pressure (!) 96/52, pulse (!) 118, temperature (!) 97.2 F (36.2 C), temperature source Rectal, resp. rate (!) 35, height 5' 3.75" (1.619 m), weight 95 kg, SpO2 98 %.     Body mass index is 36.23 kg/m.   Vent Mode: PRVC FiO2 (%):  [80 %] 80 % Set Rate:  [35 bmp] 35 bmp Vt Set:  [420 mL] 420 mL PEEP:  [18 cmH20] 18 cmH20 Plateau Pressure:  [33 cmH20-34 cmH20] 34 cmH20   Intake/Output Summary (Last 24 hours) at 06-28-2020 0900 Last data filed at 2020-06-28 0800 Gross per 24 hour  Intake 3368.65 ml  Output 3792 ml  Net -423.35 ml   Filed Weights   06/23/20 0500 06/24/20 0500 2020/06/28 0500  Weight: 95 kg 95 kg 95 kg   Physical Exam: General: adult female lying in bed on vent, critically ill appearing   HEENT: MM pink/moist, ETT, NGT, scant dried bloody secretions in nares, pupils 46mm =/reactive  Neuro: sedate, BIS 18-20, TOF 4/4 CV: s1s2 RRR, no m/r/g PULM: non-labored on vent, synchronous, vent assisted breath sounds bilaterally  GI: soft, bsx4 active, bruising to left side/ flank, ~7x7in round area of bruising  Extremities: warm/dry, anasarca, bilateral feet with non-blanching purple discoloration up  to ankles with demarcation, able to doppler posterior tibial pulses Skin: no rashes or lesions   Resolved Hospital Problem list     Assessment & Plan:   Acute hypoxemic respiratory failure / ARDS secondary to COVID-19 pneumonia Sepsis secondary to COVID-19  Completed CAP coverage 8/29  -low Vt ventilation 4-8cc/kg -goal plateau pressure <30, driving pressure <22 cm H2O -target PaO2 55-65, titrate PEEP/FiO2 per ARDS protocol  -if P/F ratio <150,  consider prone therapy for 16 hours per day -goal CVP <4, HD for volume removal  -VAP prevention measures  -follow intermittent CXR  -continue solumedrol for now -assess ABG now  Shock - DDx includes septic vs obstructive, +/- component of sedation Profound Leukocytosis  C-diff, GI PCR negative. RV down on ECHO concerning for possible obstructive physiology with hx of DVT.  SvO2 94%.  Other possible source includes bilateral feet -continue empiric sepsis abx coverage  -follow cultures -note GPC's in tracheal aspirate and ? Increased opacity in RLL -send for CTA chest to r/o PE, may need catheter directed thrombolysis  -assess CT ABD/Pelvis to review for RP bleed -assess PCT, cortisol  -add vasopressin to levophed for MAP >65  Bilateral Foot Discoloration  -warming measures  -follow exam closely  -may require amputation, husband aware  Asthma  -solumedrol as above -continue pulmicort BID, duoneb Q4   Oliguric AKI -appreciate nephrology assistance with patient care -Trend BMP / urinary output -Replace electrolytes as indicated -continue CRRT, net neg 50-187ml/hr  Sedation Needs while on Mechanical Ventilation  Anxiety -PAD protocol with versed, fentanyl for RASS goal -4 to -5 -continue NMB for now  Anemia  -follow CBC -assess CT Abd/pelvis as above  -transfuse for Hgb <7%  LLE DVT Intermittent issues with nose bleed, bloody urine  -heparin gtt per pharmacy   Hyperglycemia -SSI, resistant scale  -TF coverage 10 units Q4 -increase levemir to 35 units Q12 -continue linagliptin  Goals of care -DNR in event of arrest, established 9/1. Confirmed with husband 9/2.   Best practice:  Diet: TF Pain/Anxiety/Delirium protocol (if indicated): PAD protocol VAP protocol (if indicated): Yes DVT prophylaxis: heparin gtt  GI prophylaxis: H2 blocker Glucose control: SSI Mobility: bed rest Code Status: Full code Family Communication:  Husband updated via phone 9/3.    Disposition: ICU  Labs   CBC: Recent Labs  Lab 06/21/20 0402 06/21/20 1120 06/22/20 0309 06/22/20 1043 06/23/20 0500 06/23/20 0500 06/23/20 1154 06/23/20 1617 06/24/20 0446 06/24/20 1115 2020-07-17 0501  WBC 13.6*  --  16.4*  --  36.2*  --   --   --  68.6*  --  67.8*  NEUTROABS 10.9*  --  13.1*  --  29.1*  --   --   --  58.4*  --  PENDING  HGB 10.7*   < > 12.3   < > 10.6*   < > 10.9* 10.2* 9.8* 9.9* 8.7*  HCT 37.8   < > 42.9   < > 36.2   < > 32.0* 30.0* 33.1* 29.0* 29.0*  MCV 100.0  --  98.6  --  97.1  --   --   --  93.5  --  92.9  PLT 149*  --  232  --  307  --   --   --  342  --  220   < > = values in this interval not displayed.    Basic Metabolic Panel: Recent Labs  Lab 06/20/20 0404 06/20/20 2000 06/21/20 0557 06/21/20 1120 06/21/20 1709 06/21/20 1950  06/22/20 0309 06/22/20 1043 06/23/20 0500 06/23/20 1154 06/23/20 1635 06/24/20 0446 06/24/20 1115 06/24/20 1600 28-Jun-2020 0501  NA   < >  --  147*   < >  --    < > 143   < > 142   < > 141 138 135 134* 136  K   < >  --  4.5   < >  --    < > 4.6   < > 3.7   < > 4.2 4.9 5.0 4.6 4.7  CL   < >  --  111  --   --   --  109  --  107  --  107 104  --  101 103  CO2   < >  --  27  --   --   --  23  --  22  --  23 25  --  22 25  GLUCOSE   < >  --  388*  --   --   --  460*  --  275*  --  145* 210*  --  204* 191*  BUN   < >  --  42*  --   --   --  63*  --  87*  --  81* 44*  --  36* 28*  CREATININE   < >  --  1.42*  --   --   --  2.45*  --  3.76*  --  3.95* 2.52*  --  2.09* 1.68*  CALCIUM   < >  --  7.6*  --   --   --  7.5*  --  7.5*  --  7.4* 7.5*  --  6.8* 6.5*  MG  --  2.8* 3.1*  --  3.1*  --   --   --   --   --   --  2.7*  --   --  2.6*  PHOS   < > 5.3* 5.1*  --  4.8*   < > 5.8*  --   --   --  5.5* 4.7*  --  3.9 3.5   < > = values in this interval not displayed.   GFR: Estimated Creatinine Clearance: 48.6 mL/min (A) (by C-G formula based on SCr of 1.68 mg/dL (H)). Recent Labs  Lab 06/22/20 0309 06/23/20 0500  06/24/20 0446 06-28-2020 0501  WBC 16.4* 36.2* 68.6* 67.8*    Liver Function Tests: Recent Labs  Lab 06/21/20 0557 06/21/20 0557 06/22/20 0309 06/22/20 0309 06/23/20 0500 06/23/20 1635 06/24/20 0446 06/24/20 1600 06/28/20 0501  AST 16  --  28  --  25  --  32  --  34  ALT 16  --  16  --  19  --  28  --  21  ALKPHOS 75  --  58  --  70  --  93  --  111  BILITOT 0.5  --  0.3  --  0.7  --  0.9  --  0.7  PROT 5.3*  --  4.9*  --  4.5*  --  5.4*  --  5.4*  ALBUMIN 2.0*   < > 2.0*   < > 1.9* 2.0* 2.1* 1.8* 1.8*   < > = values in this interval not displayed.   No results for input(s): LIPASE, AMYLASE in the last 168 hours. No results for input(s): AMMONIA in the last 168 hours.  ABG    Component Value Date/Time   PHART 7.148 (LL)  06/24/2020 1115   PCO2ART 79.7 (HH) 06/24/2020 1115   PO2ART 122 (H) 06/24/2020 1115   HCO3 27.8 06/24/2020 1115   TCO2 30 06/24/2020 1115   ACIDBASEDEF 2.0 06/24/2020 1115   O2SAT 92.4 06/24/2020 1751     Coagulation Profile: No results for input(s): INR, PROTIME in the last 168 hours.  Cardiac Enzymes: No results for input(s): CKTOTAL, CKMB, CKMBINDEX, TROPONINI in the last 168 hours.  HbA1C: Hgb A1c MFr Bld  Date/Time Value Ref Range Status  06/18/2020 06:38 AM 5.6 4.8 - 5.6 % Final    Comment:    (NOTE) Pre diabetes:          5.7%-6.4%  Diabetes:              >6.4%  Glycemic control for   <7.0% adults with diabetes     CBG: Recent Labs  Lab 06/24/20 1537 06/24/20 1935 06/24/20 2316 July 11, 2020 0304 07-11-20 0806  GLUCAP 174* 213* 133* 170* 143*   Critical care time:  22 minutes    Noe Gens, MSN, NP-C [facility] Jul 11, 2020, 9:00 AM   Please see [email or web address] for pager details.

## 2020-07-23 NOTE — Progress Notes (Signed)
Patient extubated at 01/21/47 and time of death was 01/21/2151. Death verified by this nurse and Antonieta Pert, RN  Husband notified of death by Mickel Baas Gleason, PA-C. 28mls of Morphine, 80 mls of Fentanyl, and 40 mls of Versed wasted in the sink with Moinique High, RN.

## 2020-07-23 NOTE — Progress Notes (Signed)
RT NOTES:     Assisted nursing with proning patient. ET Tube taped with cloth tape at 24cm at the lip. Tube secured.

## 2020-07-23 NOTE — Death Summary Note (Signed)
DEATH SUMMARY   Patient Details  Name: Lisa [name] MRN: [id number] DOB: 17-Jul-1976  Admission/Discharge Information   Admit Date:  July 09, 2020  Date of Death: Date of Death: 2020-07-18  Time of Death: Time of Death: 2151-02-05  Length of Stay: 8  Referring Physician: Myrlene Broker, MD   Reason(s) for Hospitalization  COVID 19  Diagnoses  Preliminary cause of death:   COVID February 01, 2023 Secondary Diagnoses (including complications and co-morbidities):  Principal Problem:   Pneumonia due to COVID-19 virus Active Problems:   Hyperglycemia   Asthma   Fibromyalgia   Seasonal allergies   Acute respiratory distress syndrome (ARDS) due to COVID-19 virus ([place])   AKI (acute kidney injury) ([place])   Acute deep vein thrombosis (DVT) of left lower extremity [facility])   Brief Hospital Course (including significant findings, care, treatment, and services provided and events leading to death)  44 year old female, with PMH of asthma, BMI 30 who presented to [facility] on 2023/07/10 with acute hypoxic respiratory failure in the setting of bilateral infiltrates, COVID-19 positive.  She failed escalating O2 delivery and ultimately was intubated on 8/29.  She developed MSOF requiring initiation of CVVHD on 9/1, paralytics for ventilator synchrony.  2023/07/10 Admit  8/26 PCCM consulted / ICU admission 8/31 No further paralytics required. Remains proned on 70%. New le dvt noted and started on heparin gtt overnight 9/01 Minimal improvement with proning overnight with oxygen but still acidotic. On pressors, paralytics, CVVHD initiated. 9/02 PEEP 18 / FiO2 80%, 18 mcg levophed  Jul 19, 2023 Worsening shock, levo at 30 mcg  By 19-Jul-2023 her conditioned worsened significantly due to septic shock from MRSA pneumonia which was refractory to multiple antibiotics, vasopressors and fluids.  Her family was informed of the severity of her condition and elected to withdraw care in the face of no chance of survival.     Pertinent Labs and Studies   Significant Diagnostic Studies CT ABDOMEN PELVIS WO CONTRAST  Result Date: 07/18/20 CLINICAL DATA:  Anemia. Coronavirus infection. Assess for retroperitoneal hemorrhage. EXAM: CT ABDOMEN AND PELVIS WITHOUT CONTRAST TECHNIQUE: Multidetector CT imaging of the abdomen and pelvis was performed following the standard protocol without IV contrast. COMPARISON:  None. FINDINGS: Lower chest: Bilateral pulmonary infiltrates, pleural effusions and atelectasis. See results of chest CT. Hepatobiliary: Normal Pancreas: Normal Spleen: Normal Adrenals/Urinary Tract: Adrenal glands are normal. Left upper pole renal cyst. Kidneys otherwise normal. Bladder is normal. Foley catheter in place. Stomach/Bowel: Stomach is normal. Nasogastric tube in place. Small bowel appears unremarkable. No acute colon pathology. Colon appears to contain largely liquid stool. Rectal drain in place. Vascular/Lymphatic: Aortic atherosclerosis. No aneurysm. IVC is normal. No retroperitoneal adenopathy. No retroperitoneal hematoma. Reproductive: Previous hysterectomy.  No pelvic mass. Other: Tiny amount of free intraperitoneal fluid. No free intraperitoneal air. Musculoskeletal: No significant bone finding. IMPRESSION: 1. No evidence of retroperitoneal hematoma. 2. Bilateral pulmonary infiltrates, pleural effusions and atelectasis. See results of chest CT. 3. Tiny amount of free intraperitoneal fluid. No free intraperitoneal air. 4. Aortic atherosclerosis. Aortic Atherosclerosis (ICD10-I70.0). Electronically Signed   By: Nelson Chimes M.D.   On: 07-18-2020 10:49   DG Abd 1 View  Result Date: 06/20/2020 CLINICAL DATA:  Evaluate NG tube EXAM: ABDOMEN - 1 VIEW COMPARISON:  None. FINDINGS: The NG tube terminates in the stomach in good position. IMPRESSION: The NG tube terminates in the stomach, in good position. Electronically Signed   By: Dorise Bullion III M.D   On: 06/20/2020 11:59   CT ANGIO CHEST PE W OR  WO CONTRAST  Result Date:  Jun 27, 2020 CLINICAL DATA:  44 year old female with history of COVID infection. Suspected pulmonary embolism. EXAM: CT ANGIOGRAPHY CHEST WITH CONTRAST TECHNIQUE: Multidetector CT imaging of the chest was performed using the standard protocol during bolus administration of intravenous contrast. Multiplanar CT image reconstructions and MIPs were obtained to evaluate the vascular anatomy. CONTRAST:  178mL OMNIPAQUE IOHEXOL 350 MG/ML SOLN COMPARISON:  No priors. FINDINGS: Cardiovascular: There are several small filling defects within subsegmental sized branches in the pulmonary arteries in the lower lobes of the lungs bilaterally, indicative of small subsegmental sized pulmonary embolism. No larger central, lobar or segmental sized pulmonary embolism is identified. Heart size is normal. There is no significant pericardial fluid, thickening or pericardial calcification. Right internal jugular PermCath with tip terminating in the distal superior vena cava. Right upper extremity PICC with tip terminating in the right atrium. Mediastinum/Nodes: Patient is intubated, with the tip of the endotracheal tube 3 cm above the carina. No pathologically enlarged mediastinal or hilar lymph nodes. Nasogastric tube extending into the stomach. Esophagus is otherwise unremarkable in appearance. No axillary lymphadenopathy. Lungs/Pleura: Widespread areas of ground-glass attenuation and airspace consolidation are noted throughout the lungs bilaterally. Small to moderate right and small left pleural effusions are noted lying dependently. Upper Abdomen: Visualized portions are unremarkable. Musculoskeletal: There are no aggressive appearing lytic or blastic lesions noted in the visualized portions of the skeleton. Review of the MIP images confirms the above findings. IMPRESSION: 1. Small subsegmental sized pulmonary emboli in the lower lobes of the lungs bilaterally. No larger central, lobar or segmental sized emboli. 2. Severe multilobar  bilateral pneumonia. 3. Small to moderate right and small left pleural effusions lying dependently. 4. Support apparatus, as above. Electronically Signed   By: Vinnie Langton M.D.   On: 06-27-2020 11:46   DG CHEST PORT 1 VIEW  Result Date: 06-27-2020 CLINICAL DATA:  Acute respiratory failure with hypoxia EXAM: PORTABLE CHEST 1 VIEW COMPARISON:  06/23/2020 and prior radiographs FINDINGS: An endotracheal tube with tip 2 cm above the carina, RIGHT IJ central venous catheter with tip overlying the LOWER SVC and NG tube entering the stomach with tip off the field of view again noted. Diffuse bilateral airspace opacities are unchanged. No large pleural effusion or pneumothorax noted. Little interval change since the prior study. IMPRESSION: Unchanged appearance of the chest with diffuse bilateral airspace opacities. Electronically Signed   By: Margarette Canada M.D.   On: 06/27/20 06:52   DG CHEST PORT 1 VIEW  Result Date: 06/23/2020 CLINICAL DATA:  Central line placement. EXAM: PORTABLE CHEST 1 VIEW COMPARISON:  June 20, 2020. FINDINGS: The heart size and mediastinal contours are within normal limits. Endotracheal and nasogastric tubes appear to be in good position. Stable position of right-sided PICC line. Interval placement of right internal jugular catheter with distal tip in expected position of the SVC. Stable bilateral diffuse lung opacities are noted consistent with multifocal pneumonia. No pneumothorax or pleural effusion is noted. The visualized skeletal structures are unremarkable. IMPRESSION: Interval placement of right internal jugular catheter with distal tip in expected position of the SVC. Stable bilateral diffuse lung opacities consistent with multifocal pneumonia. Electronically Signed   By: Marijo Conception M.D.   On: 06/23/2020 11:55   DG Chest Port 1 View  Result Date: 06/20/2020 CLINICAL DATA:  ETT.  COVID-19 patient. EXAM: PORTABLE CHEST 1 VIEW COMPARISON:  June 18, 2020 FINDINGS: The ETT  is in good position. No pneumothorax. The right PICC line terminates  in the SVC. Bilateral pulmonary infiltrates persist but may be minimally improved. Stable cardiomediastinal silhouette. IMPRESSION: 1. Support apparatus as above. 2. Significant diffuse pulmonary infiltrates remain but there may have been mild interval improvement. Electronically Signed   By: Dorise Bullion III M.D   On: 06/20/2020 10:08   DG Chest Port 1 View  Result Date: 06/18/2020 CLINICAL DATA:  COVID-19.  Bilateral pneumonia. EXAM: PORTABLE CHEST 1 VIEW COMPARISON:  05/31/2020. FINDINGS: Mild pneumomediastinum cannot be completely excluded. Heart size stable. Diffuse progressive bilateral pulmonary infiltrates. No pleural effusion or pneumothorax. No acute bony abnormality. IMPRESSION: 1.  Diffuse progressive bilateral pulmonary infiltrates. 2. Mild pneumomediastinum cannot be completely excluded. No pneumothorax. Electronically Signed   By: Marcello Moores  Register   On: 06/18/2020 05:40   DG Chest Port 1 View  Result Date: 06/18/2020 CLINICAL DATA:  Shortness of breath, COVID EXAM: PORTABLE CHEST 1 VIEW COMPARISON:  None. FINDINGS: Low lung volumes. Fairly widespread bilateral ground-glass opacities and patchy consolidations. Normal heart size. No pneumothorax. IMPRESSION: Bilateral ground-glass opacities and patchy consolidations, consistent with bilateral pneumonia Electronically Signed   By: Donavan Foil M.D.   On: 06/21/2020 21:42   VAS [place] LOWER EXTREMITY VENOUS (DVT)  Result Date: 06/21/2020  Lower Venous DVTStudy Indications: Swelling, Edema, and Covid +, Elevated d dimer.  Performing Technologist: Griffin Basil RCT RDMS  Examination Guidelines: A complete evaluation includes B-mode imaging, spectral Doppler, color Doppler, and power Doppler as needed of all accessible portions of each vessel. Bilateral testing is considered an integral part of a complete examination. Limited examinations for reoccurring indications may be  performed as noted. The reflux portion of the exam is performed with the patient in reverse Trendelenburg.  +---------+---------------+---------+-----------+----------+--------------+ RIGHT    CompressibilityPhasicitySpontaneityPropertiesThrombus Aging +---------+---------------+---------+-----------+----------+--------------+ CFV      Full           Yes      Yes                                 +---------+---------------+---------+-----------+----------+--------------+ SFJ      Full                                                        +---------+---------------+---------+-----------+----------+--------------+ FV Prox  Full                                                        +---------+---------------+---------+-----------+----------+--------------+ FV Mid   Full                                                        +---------+---------------+---------+-----------+----------+--------------+ FV DistalFull                                                        +---------+---------------+---------+-----------+----------+--------------+ PFV  Full                                                        +---------+---------------+---------+-----------+----------+--------------+ POP      Full           Yes      Yes                                 +---------+---------------+---------+-----------+----------+--------------+ PTV      Full                                                        +---------+---------------+---------+-----------+----------+--------------+ PERO     Full                                                        +---------+---------------+---------+-----------+----------+--------------+   +---------+---------------+---------+-----------+----------+--------------+ LEFT     CompressibilityPhasicitySpontaneityPropertiesThrombus Aging +---------+---------------+---------+-----------+----------+--------------+ CFV      Full            Yes      Yes                                 +---------+---------------+---------+-----------+----------+--------------+ SFJ      Full                                                        +---------+---------------+---------+-----------+----------+--------------+ FV Prox  Full                                                        +---------+---------------+---------+-----------+----------+--------------+ FV Mid   Full                                                        +---------+---------------+---------+-----------+----------+--------------+ FV DistalFull                                                        +---------+---------------+---------+-----------+----------+--------------+ PFV      Full                                                        +---------+---------------+---------+-----------+----------+--------------+  POP      Full           Yes      Yes                                 +---------+---------------+---------+-----------+----------+--------------+ PTV      None                                                        +---------+---------------+---------+-----------+----------+--------------+ PERO     None                                                        +---------+---------------+---------+-----------+----------+--------------+     Summary: RIGHT: - There is no evidence of deep vein thrombosis in the lower extremity.  - No cystic structure found in the popliteal fossa.  LEFT: - Findings consistent with acute deep vein thrombosis involving the left posterior tibial veins, and left peroneal veins. - No cystic structure found in the popliteal fossa.  *See table(s) above for measurements and observations. Electronically signed by Ruta Hinds MD on 06/21/2020 at 4:20:43 PM.    Final    ECHOCARDIOGRAM LIMITED  Result Date: 06/24/2020    ECHOCARDIOGRAM LIMITED REPORT   Patient Name:   Lisa [name] Date of Exam:  06/24/2020 Medical Rec #:  [id number]       Height:       63.7 in Accession #:    [id number]      Weight:       209.4 lb Date of Birth:  1976/01/25       BSA:          1.989 m Patient Age:    48 years        BP:           103/54 mmHg Patient Gender: F               HR:           121 bpm. Exam Location:  Inpatient Procedure: Limited Color Doppler, Cardiac Doppler and Limited Echo Indications:    dyspnea  History:        Patient has no prior history of Echocardiogram examinations.                 Covid pneumonia. asthma.  Sonographer:    Johny Chess Referring Phys: [place]  1. Left ventricular ejection fraction, by estimation, is >75%. The left ventricle has hyperdynamic function. The left ventricle has no regional wall motion abnormalities.  2. Significant RV enlargement and failure . Right ventricular systolic function is severely reduced. The right ventricular size is severely enlarged. There is moderately elevated pulmonary artery systolic pressure.  3. The mitral valve is normal in structure. Trivial mitral valve regurgitation. No evidence of mitral stenosis.  4. Tricuspid valve regurgitation is moderate.  5. The aortic valve is normal in structure. Aortic valve regurgitation is not visualized. No aortic stenosis is present.  6. The inferior vena cava is dilated in size with <50% respiratory variability, suggesting right atrial pressure of 15  mmHg. FINDINGS  Left Ventricle: Left ventricular ejection fraction, by estimation, is >75%. The left ventricle has hyperdynamic function. The left ventricle has no regional wall motion abnormalities. The left ventricular internal cavity size was small. There is no left  ventricular hypertrophy. Right Ventricle: Significant RV enlargement and failure. The right ventricular size is severely enlarged. Right vetricular wall thickness was not assessed. Right ventricular systolic function is severely reduced. There is moderately elevated pulmonary  artery systolic pressure. The tricuspid regurgitant velocity is 3.73 m/s, and with an assumed right atrial pressure of 3 mmHg, the estimated right ventricular systolic pressure is 27.2 mmHg. Left Atrium: Left atrial size was normal in size. Right Atrium: Right atrial size was normal in size. Pericardium: There is no evidence of pericardial effusion. Mitral Valve: The mitral valve is normal in structure. Normal mobility of the mitral valve leaflets. Trivial mitral valve regurgitation. No evidence of mitral valve stenosis. Tricuspid Valve: The tricuspid valve is normal in structure. Tricuspid valve regurgitation is moderate . No evidence of tricuspid stenosis. Aortic Valve: The aortic valve is normal in structure. Aortic valve regurgitation is not visualized. No aortic stenosis is present. Pulmonic Valve: The pulmonic valve was normal in structure. Pulmonic valve regurgitation is mild. No evidence of pulmonic stenosis. Aorta: The aortic root is normal in size and structure. Venous: The inferior vena cava is dilated in size with less than 50% respiratory variability, suggesting right atrial pressure of 15 mmHg. IAS/Shunts: No atrial level shunt detected by color flow Doppler. LEFT VENTRICLE PLAX 2D LVIDd:         3.20 cm Diastology LVIDs:         1.80 cm LV e' lateral:   9.68 cm/s LV PW:         1.00 cm LV E/e' lateral: 7.8 LV IVS:        0.80 cm LV e' medial:    8.27 cm/s                        LV E/e' medial:  9.1  RIGHT VENTRICLE             IVC RV S prime:     16.90 cm/s  IVC diam: 1.60 cm TAPSE (M-mode): 1.1 cm LEFT ATRIUM         Index LA diam:    2.70 cm 1.36 cm/m   AORTA Ao Root diam: 2.80 cm MITRAL VALVE               TRICUSPID VALVE MV Area (PHT): 3.06 cm    TR Peak grad:   55.7 mmHg MV Decel Time: 248 msec    TR Vmax:        373.00 cm/s MV E velocity: 75.40 cm/s MV A velocity: 72.80 cm/s MV E/A ratio:  1.04 Jenkins Rouge MD Electronically signed by Jenkins Rouge MD Signature Date/Time: 06/24/2020/3:32:42 PM     Final    [place] EKG SITE RITE  Result Date: 06/19/2020 If Site Rite image not attached, placement could not be confirmed due to current cardiac rhythm.   Microbiology Recent Results (from the past 240 hour(s))  MRSA PCR Screening     Status: Abnormal   Collection Time: 06/23/20  6:30 PM   Specimen: Nasal Mucosa; Nasopharyngeal  Result Value Ref Range Status   MRSA by PCR (A) NEGATIVE Final    INVALID, UNABLE TO DETERMINE THE PRESENCE OF TARGET DUE TO SPECIMEN INTEGRITY. RECOLLECTION REQUESTED.    Comment: RESULT  CALLED TO, READ BACK BY AND VERIFIED WITH: Olen Pel RN 06/24/20 0210 JDW Performed at [facility], [place] [address], [place], [place] [address]   Culture, blood (Routine X 2) w Reflex to ID Panel     Status: None   Collection Time: 06/24/20 11:05 AM   Specimen: BLOOD  Result Value Ref Range Status   Specimen Description BLOOD LEFT ANTECUBITAL  Final   Special Requests   Final    BOTTLES DRAWN AEROBIC AND ANAEROBIC Blood Culture adequate volume   Culture   Final    NO GROWTH 5 DAYS Performed at [facility], [place] [address], [place], [place] [address]    Report Status 06/29/2020 FINAL  Final  Gastrointestinal Panel by PCR , Stool     Status: None   Collection Time: 06/24/20 11:41 AM   Specimen: Stool  Result Value Ref Range Status   Campylobacter species NOT DETECTED NOT DETECTED Final   Plesimonas shigelloides NOT DETECTED NOT DETECTED Final   Salmonella species NOT DETECTED NOT DETECTED Final   Yersinia enterocolitica NOT DETECTED NOT DETECTED Final   Vibrio species NOT DETECTED NOT DETECTED Final   Vibrio cholerae NOT DETECTED NOT DETECTED Final   Enteroaggregative E coli (EAEC) NOT DETECTED NOT DETECTED Final   Enteropathogenic E coli (EPEC) NOT DETECTED NOT DETECTED Final   Enterotoxigenic E coli (ETEC) NOT DETECTED NOT DETECTED Final   Shiga like toxin producing E coli (STEC) NOT DETECTED NOT DETECTED Final   Shigella/Enteroinvasive E coli (EIEC) NOT  DETECTED NOT DETECTED Final   Cryptosporidium NOT DETECTED NOT DETECTED Final   Cyclospora cayetanensis NOT DETECTED NOT DETECTED Final   Entamoeba histolytica NOT DETECTED NOT DETECTED Final   Giardia lamblia NOT DETECTED NOT DETECTED Final   Adenovirus F40/41 NOT DETECTED NOT DETECTED Final   Astrovirus NOT DETECTED NOT DETECTED Final   Norovirus GI/GII NOT DETECTED NOT DETECTED Final   Rotavirus A NOT DETECTED NOT DETECTED Final   Sapovirus (I, II, IV, and V) NOT DETECTED NOT DETECTED Final    Comment: Performed at [facility], [address] [place], [place] [address]  C Difficile Quick Screen (NO PCR Reflex)     Status: None   Collection Time: 06/24/20 11:41 AM   Specimen: Stool  Result Value Ref Range Status   C Diff antigen NEGATIVE NEGATIVE Final   C Diff toxin NEGATIVE NEGATIVE Final   C Diff interpretation No C. difficile detected.  Final    Comment: Performed at [facility], [place] [address], [place], [place] [address]  MRSA PCR Screening     Status: None   Collection Time: 06/24/20  4:08 PM   Specimen: Nasal Mucosa; Nasopharyngeal  Result Value Ref Range Status   MRSA by PCR NEGATIVE NEGATIVE Final    Comment:        The GeneXpert MRSA Assay (FDA approved for NASAL specimens only), is one component of a comprehensive MRSA colonization surveillance program. It is not intended to diagnose MRSA infection nor to guide or monitor treatment for MRSA infections. Performed at [facility], [place] [address], [place],  [address]   Culture, respiratory     Status: None   Collection Time: 06/24/20  5:45 PM   Specimen: Tracheal Aspirate  Result Value Ref Range Status   Specimen Description TRACHEAL ASPIRATE  Final   Special Requests NONE  Final   Gram Stain   Final    MODERATE WBC PRESENT, PREDOMINANTLY PMN  MODERATE GRAM POSITIVE COCCI IN PAIRS IN CLUSTERS Performed at [facility], [place] [address] [place], [place] [address]    Culture    Final    ABUNDANT METHICILLIN RESISTANT STAPHYLOCOCCUS AUREUS   Report Status 06/26/2020 FINAL  Final   Organism ID, Bacteria METHICILLIN RESISTANT STAPHYLOCOCCUS AUREUS  Final      Susceptibility   Methicillin resistant staphylococcus aureus - MIC*    CIPROFLOXACIN <=0.5 SENSITIVE Sensitive     ERYTHROMYCIN >=8 RESISTANT Resistant     GENTAMICIN <=0.5 SENSITIVE Sensitive     OXACILLIN >=4 RESISTANT Resistant     TETRACYCLINE <=1 SENSITIVE Sensitive     VANCOMYCIN 1 SENSITIVE Sensitive     TRIMETH/SULFA <=10 SENSITIVE Sensitive     CLINDAMYCIN <=0.25 SENSITIVE Sensitive     RIFAMPIN <=0.5 SENSITIVE Sensitive     Inducible Clindamycin NEGATIVE Sensitive     * ABUNDANT METHICILLIN RESISTANT STAPHYLOCOCCUS AUREUS    Lab Basic Metabolic Panel: Recent Labs  Lab 06/23/20 1635 06/23/20 1635 06/24/20 0446 06/24/20 0446 06/24/20 1115 06/24/20 1600 Jul 23, 2020 0501 07-23-2020 0908 July 23, 2020 1600  NA 141   < > 138   < > 135 134* 136 135 134*  K 4.2   < > 4.9   < > 5.0 4.6 4.7 5.1 4.6  CL 107  --  104  --   --  101 103  --  102  CO2 23  --  25  --   --  22 25  --  23  GLUCOSE 145*  --  210*  --   --  204* 191*  --  113*  BUN 81*  --  44*  --   --  36* 28*  --  20  CREATININE 3.95*  --  2.52*  --   --  2.09* 1.68*  --  1.36*  CALCIUM 7.4*  --  7.5*  --   --  6.8* 6.5*  --  5.5*  MG  --   --  2.7*  --   --   --  2.6*  --   --   PHOS 5.5*  --  4.7*  --   --  3.9 3.5  --  2.6   < > = values in this interval not displayed.   Liver Function Tests: Recent Labs  Lab 06/23/20 0500 06/23/20 0500 06/23/20 1635 06/24/20 0446 06/24/20 1600 07-23-2020 0501 2020-07-23 1600  AST 25  --   --  32  --  34  --   ALT 19  --   --  28  --  21  --   ALKPHOS 70  --   --  93  --  111  --   BILITOT 0.7  --   --  0.9  --  0.7  --   PROT 4.5*  --   --  5.4*  --  5.4*  --   ALBUMIN 1.9*   < > 2.0* 2.1* 1.8* 1.8* 1.6*   < > = values in this interval not displayed.   No results for input(s): LIPASE, AMYLASE  in the last 168 hours. No results for input(s): AMMONIA in the last 168 hours. CBC: Recent Labs  Lab 06/23/20 0500 06/23/20 1154 06/23/20 1617 06/24/20 0446 06/24/20 1115 07/23/20 0501 07/23/20 0908  WBC 36.2*  --   --  68.6*  --  67.8*  --   NEUTROABS 29.1*  --   --  58.4*  --  63.7*  --  HGB 10.6*   < > 10.2* 9.8* 9.9* 8.7* 9.5*  HCT 36.2   < > 30.0* 33.1* 29.0* 29.0* 28.0*  MCV 97.1  --   --  93.5  --  92.9  --   PLT 307  --   --  342  --  220  --    < > = values in this interval not displayed.   Cardiac Enzymes: No results for input(s): CKTOTAL, CKMB, CKMBINDEX, TROPONINI in the last 168 hours. Sepsis Labs: Recent Labs  Lab 06/23/20 0500 06/24/20 0446 06/27/2020 0501  PROCALCITON  --   --  5.89  WBC 36.2* 68.6* 67.8*    Procedures/Operations  ETT 8/29 >> L Rad [place] 8/31 >>  [facility] HD 9/1 >>   Roselie Awkward 06/29/2020, 9:09 AM

## 2020-07-23 NOTE — Progress Notes (Signed)
[place] for Heparin  Indication: DVT  Allergies  Allergen Reactions  . Effexor [Venlafaxine Hcl] Other (See Comments)    Pt becomes suicidal  . Percocet [Oxycodone-Acetaminophen] Nausea And Vomiting  . Vicodin [Hydrocodone-Acetaminophen] Nausea And Vomiting    Patient Measurements: Height: 5' 3.75" (161.9 cm) Weight: 95 kg (209 lb 7 oz) IBW/kg (Calculated) : 54.13 Heparin Dosing Weight: 74.9kg  Vital Signs: Temp: 96.8 F (36 C) (09/03 1550) Temp Source: Core (09/03 1550) BP: 87/56 (09/03 1509) Pulse Rate: 114 (09/03 1600)  Labs: Recent Labs    06/22/20 2200 06/23/20 0500 06/23/20 0900 06/23/20 1154 06/24/20 0446 06/24/20 0446 06/24/20 1009 06/24/20 1115 06/24/20 1115 06/24/20 1409 06/24/20 1600 06/24/20 2037 06-26-2020 0500 06-26-20 0501 26-Jun-2020 0908 Jun 26, 2020 1420 06-26-2020 1600  HGB  --  10.6*  --    < > 9.8*  --   --  9.9*   < >  --   --   --   --  8.7* 9.5*  --   --   HCT  --  36.2  --    < > 33.1*  --   --  29.0*  --   --   --   --   --  29.0* 28.0*  --   --   PLT  --  307  --   --  342  --   --   --   --   --   --   --   --  220  --   --   --   APTT  --   --  50*  --   --   --   --   --   --   --   --   --   --   --   --   --   --   HEPARINUNFRC   < >  --  1.00*   < >  --   --    < >  --   --   --   --  0.21* 0.23*  --   --  0.20*  --   CREATININE  --  3.76*  --    < > 2.52*   < >  --   --   --   --  2.09*  --   --  1.68*  --   --  1.36*  TROPONINIHS  --   --   --   --   --   --   --   --   --  600*  --   --   --   --   --   --   --    < > = values in this interval not displayed.    Estimated Creatinine Clearance: 60 mL/min (A) (by C-G formula based on SCr of 1.36 mg/dL (H)).  Assessment: 44 y.o. female with DVT for heparin.   Initially required Heparin to be held and large rate reductions due to high heparin levels. APTT checked and only 50. Once level was down to normal range, resumed at low rate.   Heparin level  this afternoon remains SUBtherapeutic - rate appears to have not been adjusted earlier today. Still running at 350 units/hr.  Goal of Therapy:  Heparin level 0.3-0.5 units/ml Monitor platelets by anticoagulation protocol: Yes   Plan:  - Increase heparin to 500 units/hr (5 ml/hr) - Will continue to monitor for any signs/symptoms of bleeding and will follow  up with heparin level in 6 hours   Thank you for allowing pharmacy to be a part of this patient's care.  Alycia Rossetti, PharmD, BCPS Clinical Pharmacist Clinical phone for 27-Jun-2020: [phone] 27-Jun-2020 6:44 PM   **Pharmacist phone directory can now be found on [email or web address] ([email or web address]).  Listed under [place].

## 2020-07-23 NOTE — Progress Notes (Signed)
Spoke with husband, despite adding Epi infusion, patients BP still declining. Patient temp 96.1 core. Husband stated he wants to come see her. Spoke with Dr. [place] Bells who agreed. Spouse stated he is on his way.

## 2020-07-23 NOTE — Progress Notes (Signed)
Husband Elta Guadeloupe) called for update on patients status.  Reviewed addition of 3rd blood pressure agent and worsening shock as day has progressed.  Discussed ongoing support of patient, test results from the am to include CT chest with angio which was negative for PE but showed dense infiltrates, abd/pelvis which was negative for bleed. He understands that she has continued to worsen and the addition of epinephrine gtt may not change the outcome.  She is DNR.  I offered visitation to him if he wanted and he would like to wait and see how she responds to the epinephrine gtt. They have a  32 & 26 y/o daughters who were also COVID positive.  He tested negative and has been asymptomatic for more than 10 days.  Support offered to family.     Noe Gens, MSN, NP-C [facility] 07-16-2020, 4:03 PM   Please see [email or web address] for pager details.

## 2020-07-23 NOTE — Progress Notes (Signed)
[place] for Heparin  Indication: DVT  Allergies  Allergen Reactions  . Effexor [Venlafaxine Hcl] Other (See Comments)    Pt becomes suicidal  . Percocet [Oxycodone-Acetaminophen] Nausea And Vomiting  . Vicodin [Hydrocodone-Acetaminophen] Nausea And Vomiting    Patient Measurements: Height: 5' 3.75" (161.9 cm) Weight: 95 kg (209 lb 7 oz) IBW/kg (Calculated) : 54.13 Heparin Dosing Weight: 74.9kg  Vital Signs: Temp: 98.6 F (37 C) (09/03 0400) Temp Source: Rectal (09/03 0400) Pulse Rate: 123 (09/03 0400)  Labs: Recent Labs    06/22/20 2200 06/23/20 0500 06/23/20 0500 06/23/20 0900 06/23/20 1154 06/23/20 1617 06/23/20 1635 06/23/20 1705 06/24/20 0054 06/24/20 0446 06/24/20 1009 06/24/20 1115 06/24/20 1409 06/24/20 1600 06/24/20 2037 07/02/2020 0500  HGB  --  10.6*  --   --    < > 10.2*  --    < >  --  9.8*  --  9.9*  --   --   --   --   HCT  --  36.2  --   --    < > 30.0*  --   --   --  33.1*  --  29.0*  --   --   --   --   PLT  --  307  --   --   --   --   --   --   --  342  --   --   --   --   --   --   APTT  --   --   --  50*  --   --   --   --   --   --   --   --   --   --   --   --   HEPARINUNFRC   < >  --   --  1.00*   < >  --   --    < >   < >  --  0.33  --   --   --  0.21* 0.23*  CREATININE  --  3.76*   < >  --   --   --  3.95*  --   --  2.52*  --   --   --  2.09*  --   --   TROPONINIHS  --   --   --   --   --   --   --   --   --   --   --   --  600*  --   --   --    < > = values in this interval not displayed.    Estimated Creatinine Clearance: 39 mL/min (A) (by C-G formula based on SCr of 2.09 mg/dL (H)).  Assessment: 44 y.o. female with DVT for heparin.   Initially required Heparin to be held and large rate reductions due to high heparin levels. APTT checked and only 50. Once level was down to normal range, resumed at low rate.   Heparin level subtherapeutic (0.23) on gtt at 350 units/hr. No issues with line or  bleeding reported per RN.  Goal of Therapy:  Heparin level 0.3-0.5 units/ml Monitor platelets by anticoagulation protocol: Yes   Plan:  Increase heparin to 500 units/hr F/u 6 hr heparin level  Sherlon Handing, PharmD, BCPS Please see amion for complete clinical pharmacist phone list 07-02-20 6:25 AM

## 2020-07-23 NOTE — Progress Notes (Signed)
Pt was extubated at 2148 and passed at 2152.  Pt's husband Elta Guadeloupe was notified.    Otilio Carpen Lutisha Knoche, PA-C

## 2020-07-23 NOTE — Progress Notes (Signed)
Pt transported to CT from 3M14 without significant incident.

## 2020-07-23 NOTE — Procedures (Signed)
Extubation Procedure Note  Patient Details:   Name: Lisa [name] DOB: Dec 26, 1975 MRN: [id number]   Airway Documentation:    Vent end date: 07/20/20 Vent end time: 2148   Evaluation  O2 sats: currently acceptable Complications: No apparent complications Patient did tolerate procedure well. Bilateral Breath Sounds: Diminished   No   Pt terminally extubated per family and MD order  Myrtis Ser 2020/07/20, 10:32 PM

## 2020-07-23 DEATH — deceased

## 2022-03-12 IMAGING — DX DG CHEST 1V PORT
1 series · 1 of 1 positions shown · non-contrast
Comparison: June 20, 2020.

CLINICAL DATA: Central line placement.

EXAM:
PORTABLE CHEST 1 VIEW

[chest]
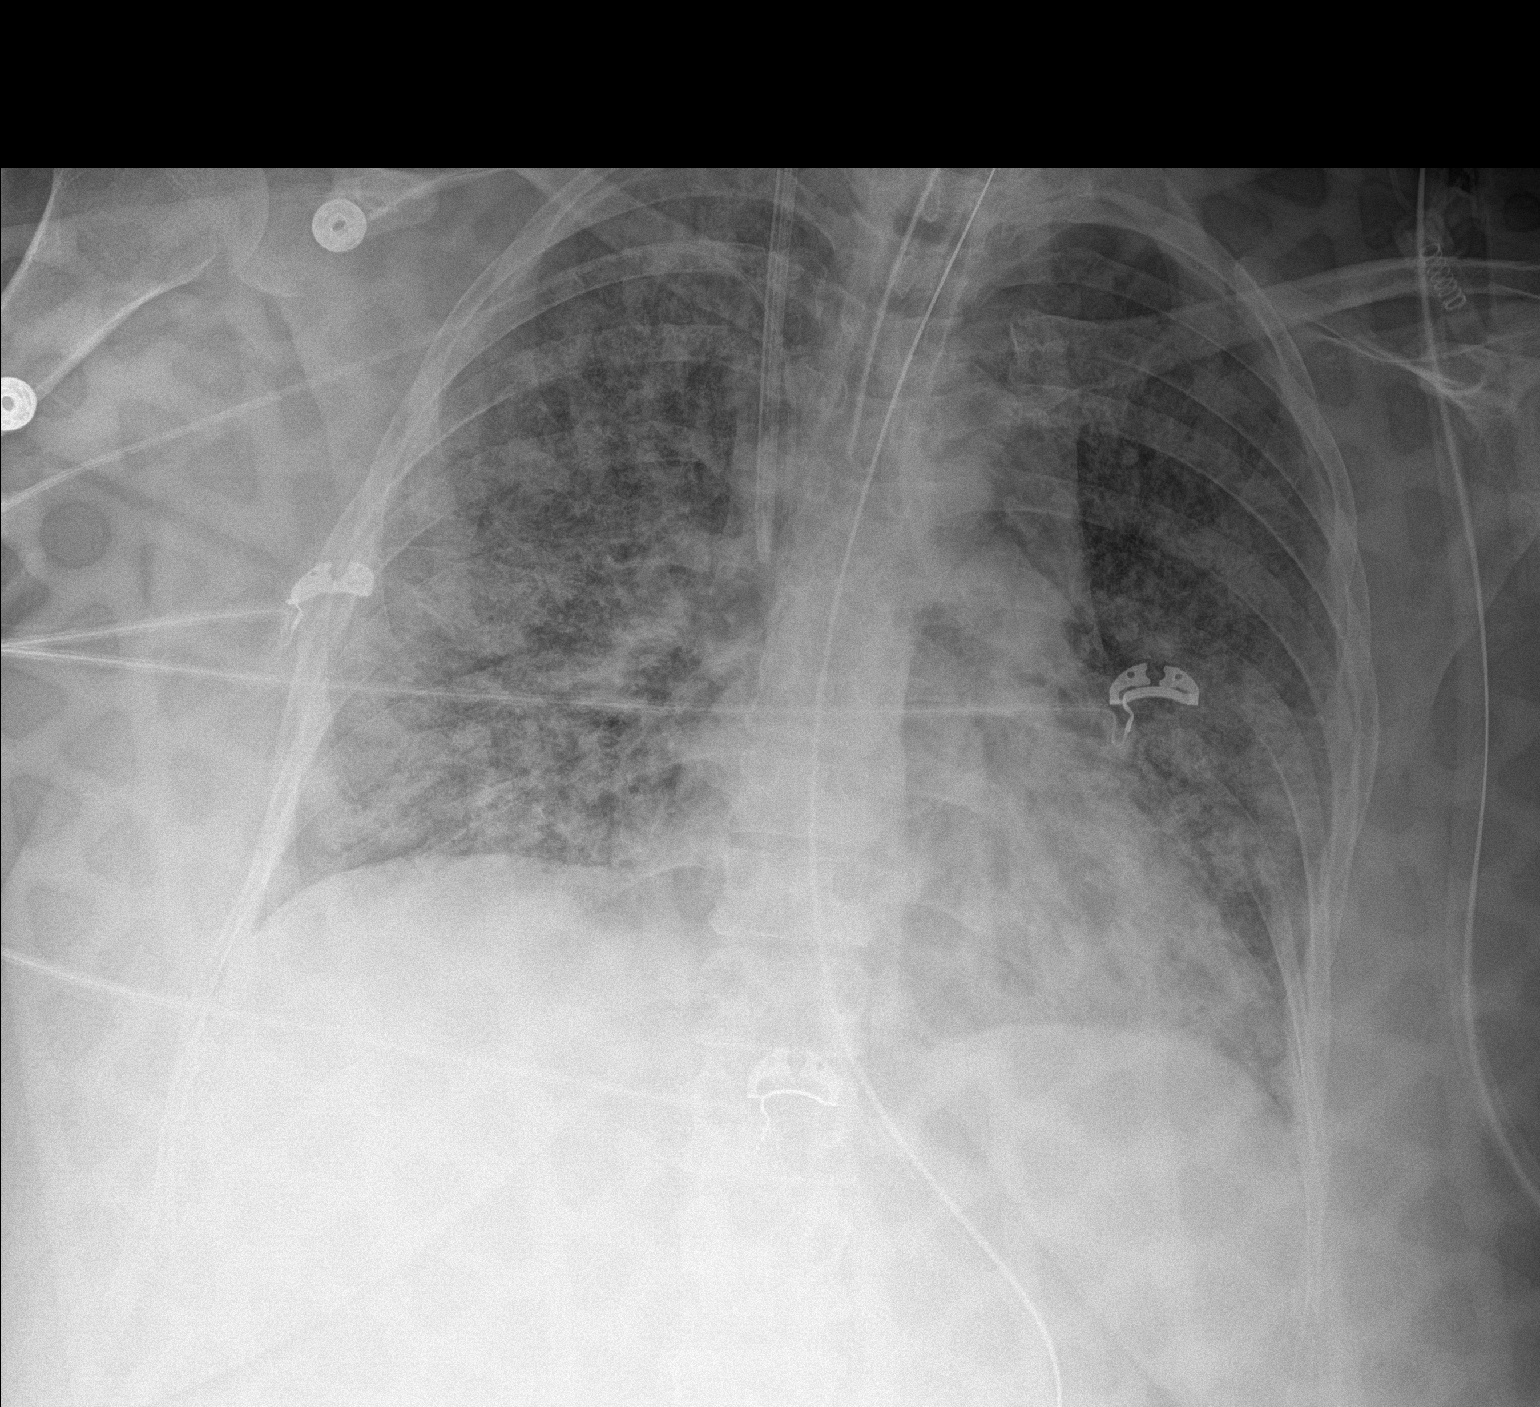

[1 of 1 positions shown; findings below may reference images not displayed]

FINDINGS: The heart size and mediastinal contours are within normal limits.
Endotracheal and nasogastric tubes appear to be in good position.
Stable position of right-sided PICC line. Interval placement of
right internal jugular catheter with distal tip in expected position
of the SVC. Stable bilateral diffuse lung opacities are noted
consistent with multifocal pneumonia. No pneumothorax or pleural
effusion is noted. The visualized skeletal structures are
unremarkable.
IMPRESSION: Interval placement of right internal jugular catheter with distal
tip in expected position of the SVC. Stable bilateral diffuse lung
opacities consistent with multifocal pneumonia.

## 2022-03-14 IMAGING — CT CT ANGIO CHEST
3 of 13 series · 18 of 46 positions shown · IV contrast (APPLIED)
Comparison: No priors.

CLINICAL DATA: 44-year-old female with history of COVID infection.
Suspected pulmonary embolism.

EXAM:
CT ANGIOGRAPHY CHEST WITH CONTRAST
TECHNIQUE: Multidetector CT imaging of the chest was performed using the
standard protocol during bolus administration of intravenous
contrast. Multiplanar CT image reconstructions and MIPs were
obtained to evaluate the vascular anatomy.
CONTRAST:  100mL OMNIPAQUE IOHEXOL 350 MG/ML SOLN

[Series 8: thins · axial · 0.73mm/px · z∈[+1290,+1324]mm · 2 of 241 slices shown (1 of 2)]
[im 35/241  lung]
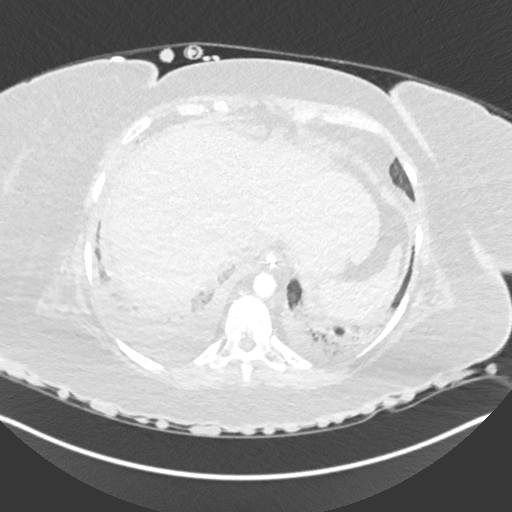
[im 69/241  lung]
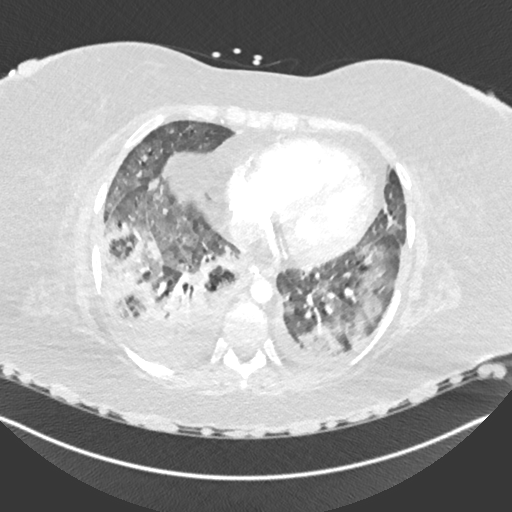

[Series 16: thins · axial · 0.98mm/px · z∈[+891,+1314]mm · 15 of 485 slices shown (2 of 2)]
[im 31/485  lung]
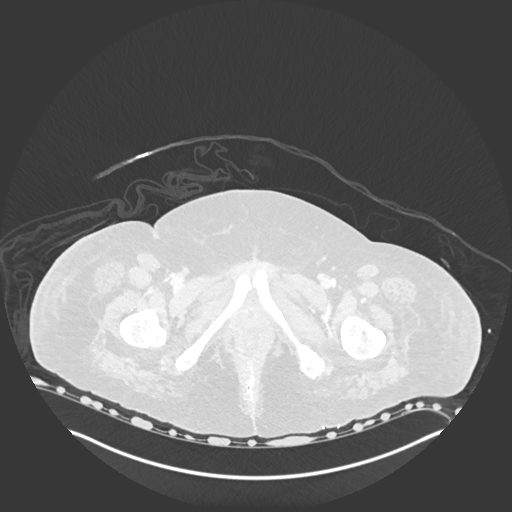
[im 61/485  soft-tissue]
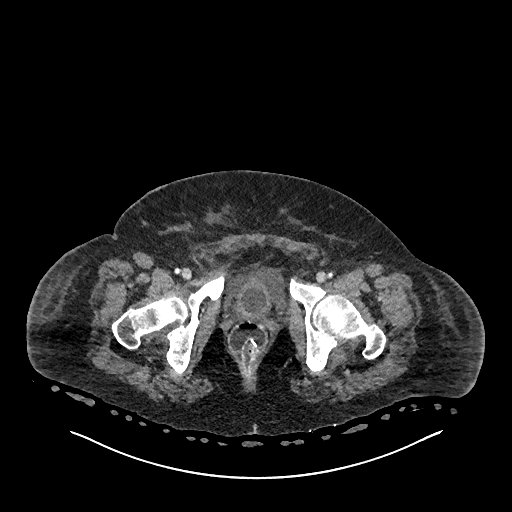
[im 91/485  lung]
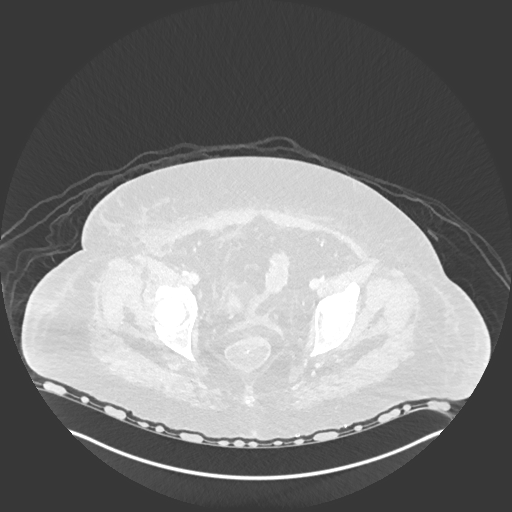
[im 122/485  soft-tissue]
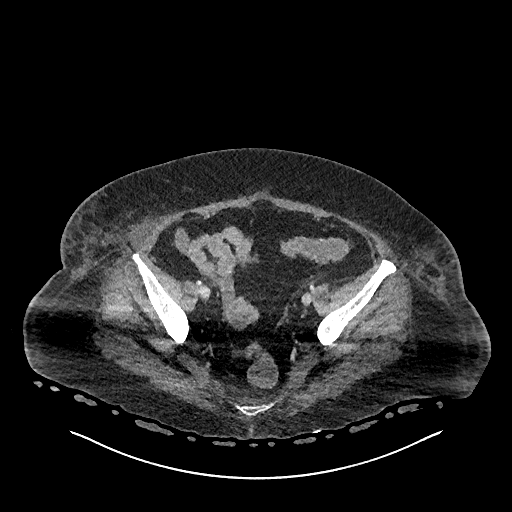
[im 152/485  lung]
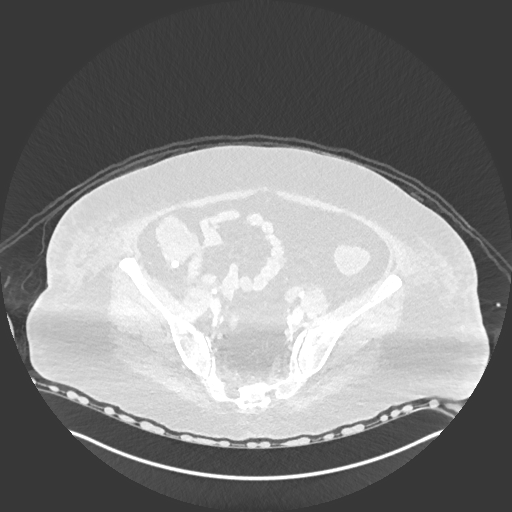
[im 182/485  soft-tissue]
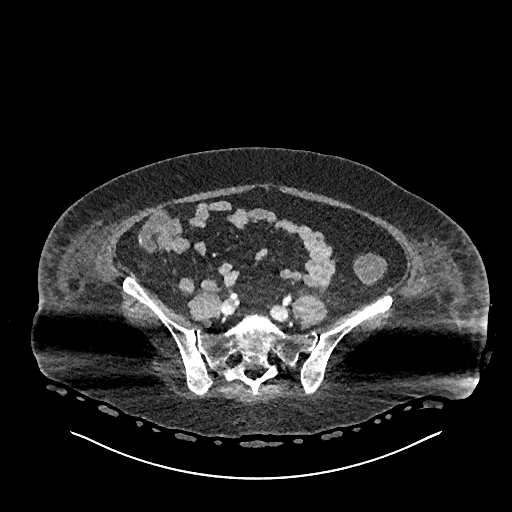
[im 212/485  lung]
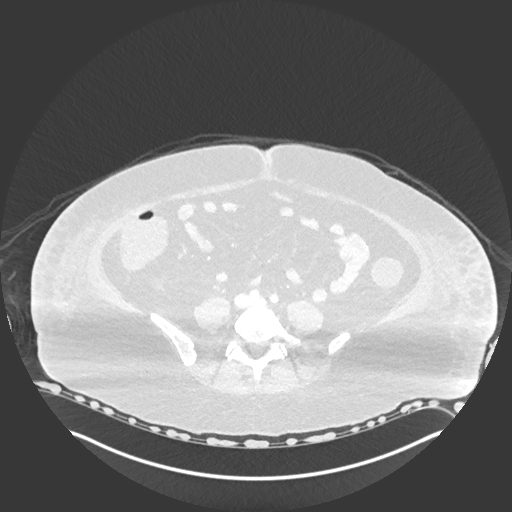
[im 243/485  soft-tissue]
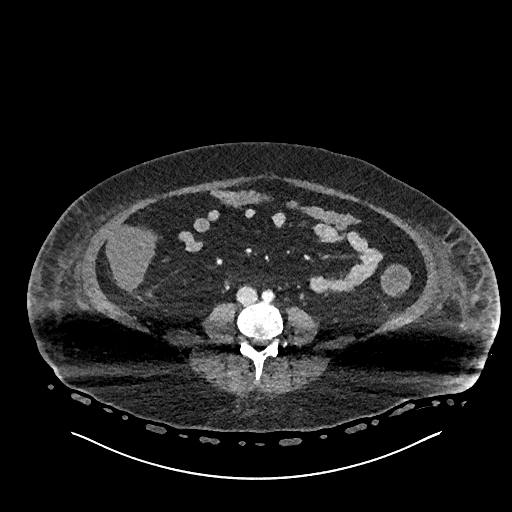
[im 273/485  lung]
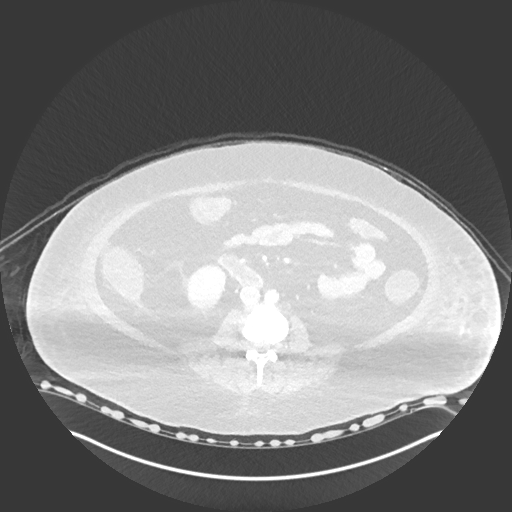
[im 303/485  soft-tissue]
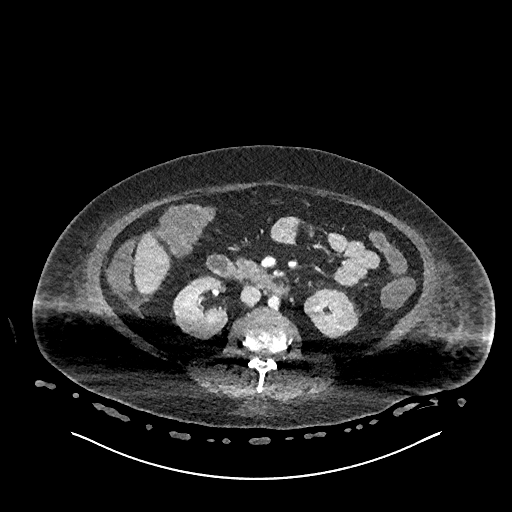
[im 333/485  lung]
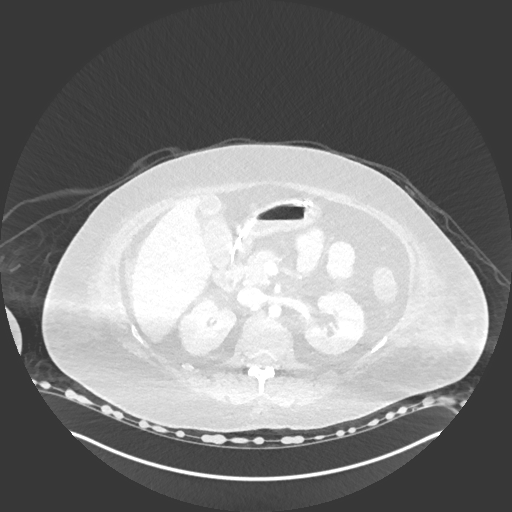
[im 364/485  soft-tissue]
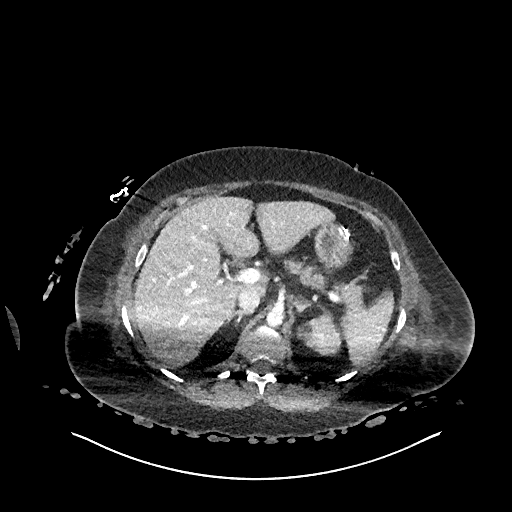
[im 394/485  lung]
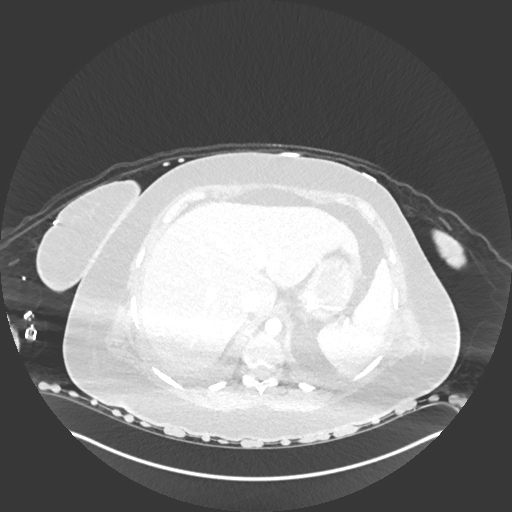
[im 424/485  soft-tissue]
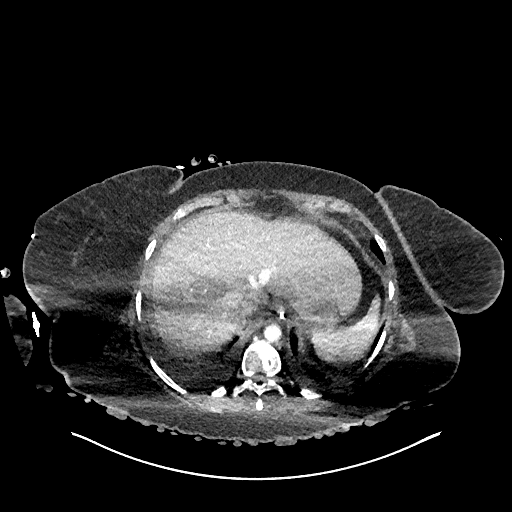
[im 454/485  lung]
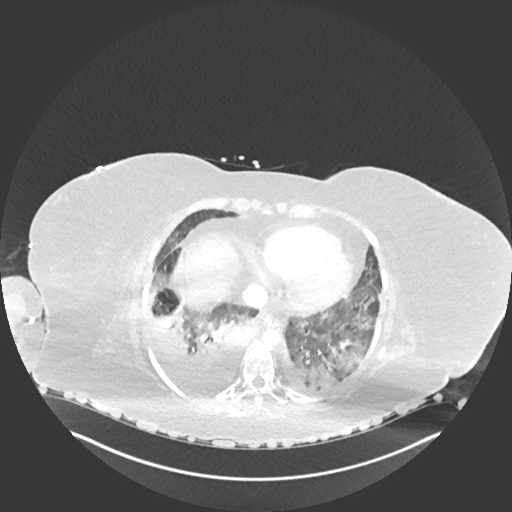

[Series 17: coronals · coronal · 0.93mm/px · 1 of 120 slices shown]
[im 60/120  soft-tissue]
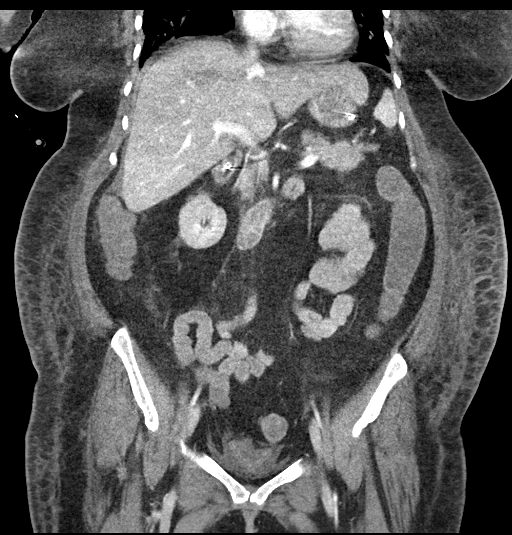

[18 of 46 positions shown; findings below may reference images not displayed]

FINDINGS: Cardiovascular: There are several small filling defects within
subsegmental sized branches in the pulmonary arteries in the lower
lobes of the lungs bilaterally, indicative of small subsegmental
sized pulmonary embolism. No larger central, lobar or segmental
sized pulmonary embolism is identified. Heart size is normal. There
is no significant pericardial fluid, thickening or pericardial
calcification. Right internal jugular PermCath with tip terminating
in the distal superior vena cava. Right upper extremity PICC with
tip terminating in the right atrium.

Mediastinum/Nodes: Patient is intubated, with the tip of the
endotracheal tube 3 cm above the carina. No pathologically enlarged
mediastinal or hilar lymph nodes. Nasogastric tube extending into
the stomach. Esophagus is otherwise unremarkable in appearance. No
axillary lymphadenopathy.

Lungs/Pleura: Widespread areas of ground-glass attenuation and
airspace consolidation are noted throughout the lungs bilaterally.
Small to moderate right and small left pleural effusions are noted
lying dependently.

Upper Abdomen: Visualized portions are unremarkable.

Musculoskeletal: There are no aggressive appearing lytic or blastic
lesions noted in the visualized portions of the skeleton.

Review of the MIP images confirms the above findings.
IMPRESSION: 1. Small subsegmental sized pulmonary emboli in the lower lobes of
the lungs bilaterally. No larger central, lobar or segmental sized
emboli.
2. Severe multilobar bilateral pneumonia.
3. Small to moderate right and small left pleural effusions lying
dependently.
4. Support apparatus, as above.
